# Patient Record
Sex: Female | Born: 1945 | Hispanic: No | Marital: Single | Smoking: Never smoker
Health system: Southern US, Community
[De-identification: ages and names within clinical notes are randomized; demographics above are authoritative.]

## PROBLEM LIST (undated history)

## (undated) DIAGNOSIS — D751 Secondary polycythemia: Secondary | ICD-10-CM

## (undated) DIAGNOSIS — I82409 Acute embolism and thrombosis of unspecified deep veins of unspecified lower extremity: Secondary | ICD-10-CM

## (undated) DIAGNOSIS — I10 Essential (primary) hypertension: Secondary | ICD-10-CM

---

## 2001-05-24 ENCOUNTER — Emergency Department (HOSPITAL_COMMUNITY): Admission: EM | Admit: 2001-05-24 | Discharge: 2001-05-25 | Payer: Self-pay | Admitting: Emergency Medicine

## 2001-05-24 ENCOUNTER — Encounter: Payer: Self-pay | Admitting: Emergency Medicine

## 2011-10-19 ENCOUNTER — Emergency Department (HOSPITAL_COMMUNITY): Payer: Self-pay

## 2011-10-19 ENCOUNTER — Encounter (HOSPITAL_COMMUNITY): Payer: Self-pay | Admitting: *Deleted

## 2011-10-19 ENCOUNTER — Emergency Department (HOSPITAL_COMMUNITY)
Admission: EM | Admit: 2011-10-19 | Discharge: 2011-10-19 | Disposition: A | Discharge status: Home / Self Care | Payer: Self-pay | Attending: Emergency Medicine | Admitting: Emergency Medicine

## 2011-10-19 DIAGNOSIS — M25519 Pain in unspecified shoulder: Secondary | ICD-10-CM | POA: Insufficient documentation

## 2011-10-19 DIAGNOSIS — I10 Essential (primary) hypertension: Secondary | ICD-10-CM | POA: Insufficient documentation

## 2011-10-19 DIAGNOSIS — R059 Cough, unspecified: Secondary | ICD-10-CM | POA: Insufficient documentation

## 2011-10-19 DIAGNOSIS — R05 Cough: Secondary | ICD-10-CM | POA: Insufficient documentation

## 2011-10-19 HISTORY — DX: Secondary polycythemia: D75.1

## 2011-10-19 HISTORY — DX: Essential (primary) hypertension: I10

## 2011-10-19 MED ORDER — OMEPRAZOLE 20 MG PO CPDR: 20.0000 mg | DELAYED_RELEASE_CAPSULE | Freq: Every day | ORAL | Status: DC

## 2011-10-19 MED ORDER — BENZONATATE 200 MG PO CAPS: 200.0000 mg | ORAL_CAPSULE | Freq: Three times a day (TID) | ORAL | Status: AC | PRN

## 2011-10-19 MED ORDER — OXYCODONE-ACETAMINOPHEN 10-325 MG PO TABS: 1.0000 | ORAL_TABLET | Freq: Every evening | ORAL | Status: AC | PRN

## 2011-10-19 NOTE — ED Notes (Signed)
Patient is alert and oriented x3.  She does not speak english and her son is translating for her. She is complaining of a non productive cough that started 2 weeks ago.  This morning she starts To here that she has a wheeze.  She denies pain, nausea, lightheadedness or dizziness

## 2011-10-19 NOTE — ED Provider Notes (Signed)
History     CSN: [id number]  Arrival date & time 10/19/11  1433   First MD Initiated Contact with Patient 10/19/11 1609      Chief Complaint  Patient presents with  . Cough    non productive    (Consider location/radiation/quality/duration/timing/severity/associated sxs/prior treatment) Patient is a 66 y.o. female presenting with cough and shoulder pain. The history is provided by the patient.  Cough This is a new problem. The current episode started more than 1 week ago. The problem has not changed since onset.The cough is non-productive. There has been no fever. Pertinent negatives include no chest pain, no chills, no sweats, no rhinorrhea, no sore throat, no myalgias and no shortness of breath. Associated symptoms comments: Cough for the past 2 weeks that is worse when she lies down at night. No chest pain, SOB, pleuritic pain, N, V, sinus symptoms, or sore throat. She reports a change in her blood pressure medications one month ago to include Diovan. . She is not a smoker.  Shoulder Pain This is a new problem. The current episode started 1 to 4 weeks ago. The problem occurs constantly. The problem has been unchanged. Associated symptoms include coughing. Pertinent negatives include no chest pain, chills, myalgias, nausea or sore throat. Associated symptoms comments: She fell one month ago and suffered a dislocation that was reduced by her regular physician. She continues to have periodic pain in the shoulder, especially when she sleeps on the left side. She was taking Percocet from a family member (she is here visiting) that relieved her pain and allowed her to sleep. No new injury. No numbness or weakness. .    Past Medical History  Diagnosis Date  . Hypertension   . Polycythemia     History reviewed. No pertinent past surgical history.  History reviewed. No pertinent family history.  History  Substance Use Topics  . Smoking status: Not on file  . Smokeless tobacco: Not on  file  . Alcohol Use:     OB History    Grav Para Term Preterm Abortions TAB SAB Ect Mult Living                  Review of Systems  Constitutional: Negative for chills.  HENT: Negative for sore throat and rhinorrhea.   Respiratory: Positive for cough. Negative for shortness of breath.   Cardiovascular: Negative for chest pain.  Gastrointestinal: Negative for nausea.  Musculoskeletal: Negative for myalgias.       See HPI.    Allergies  Review of patient's allergies indicates no known allergies.  Home Medications   Current Outpatient Rx  Name Route Sig Dispense Refill  . ATENOLOL-CHLORTHALIDONE 100-25 MG PO TABS Oral Take 1 tablet by mouth daily.    Marland Kitchen HYDROXYUREA 500 MG PO CAPS Oral Take 500 mg by mouth daily. May take with food to minimize GI side effects.    Marland Kitchen PROTECT CARDIO PO Oral Take 1 capsule by mouth daily.    Marland Kitchen VALSARTAN 320 MG PO TABS Oral Take 320 mg by mouth daily.      BP 132/91  Pulse 64  Temp 98.1 F (36.7 C) (Oral)  Resp 18  SpO2 99%  Physical Exam  Constitutional: She is oriented to person, place, and time. She appears well-developed and well-nourished. No distress.  Cardiovascular: Normal rate and regular rhythm.   No murmur heard. Pulmonary/Chest: Effort normal. She has no wheezes. She has no rales. She exhibits no tenderness.  Abdominal: Soft. Bowel sounds  are normal. There is no tenderness.  Musculoskeletal: She exhibits no edema and no tenderness.       Left shoulder appears unremarkable. FROM. Hand grip strength is 5/5 and equal bilaterally. Pulses 2+. Tender over humeral head/deltoid. No mass. Neck nontender.  Neurological: She is alert and oriented to person, place, and time.  Skin: Skin is warm.  Psychiatric: She has a normal mood and affect.    ED Course  Procedures (including critical care time)  Labs Reviewed - No data to display Dg Chest 2 View  10/19/2011  *RADIOLOGY REPORT*  Clinical Data: Cough.  Hypertension.  CHEST - 2 VIEW   Comparison: None.  Findings: Heart size is within normal limits.  Ectasia of the thoracic aorta is noted.  Both lungs are clear. Mild scarring noted in the central left upper lobe.  No evidence of pleural effusion. No mass or lymphadenopathy identified.  IMPRESSION: No active disease.  Original Report Authenticated By: Danae Orleans, M.D.     No diagnosis found. 1. Cough 2. Shoulder pain, old   MDM  CXR negative. VSS. No symptoms of SOB, pain or illness. Consider Diovan as source of cough, but less likely than ACE medications to cause cough (2% of patients). Consider reflux and will try patient on Prilosec.        Rodena Medin, PA-C 10/19/11 1741

## 2011-10-23 NOTE — ED Provider Notes (Signed)
Medical screening examination/treatment/procedure(s) were performed by non-physician practitioner and as supervising physician I was immediately available for consultation/collaboration.  Juliet Rude. Rubin Payor, MD 10/23/11 [phone]

## 2013-08-16 IMAGING — CR DG CHEST 2V
2 series · 2 of 2 positions shown · non-contrast
Comparison: None.

CLINICAL DATA: Cough.  Hypertension.

CHEST - 2 VIEW

[w chest pa]
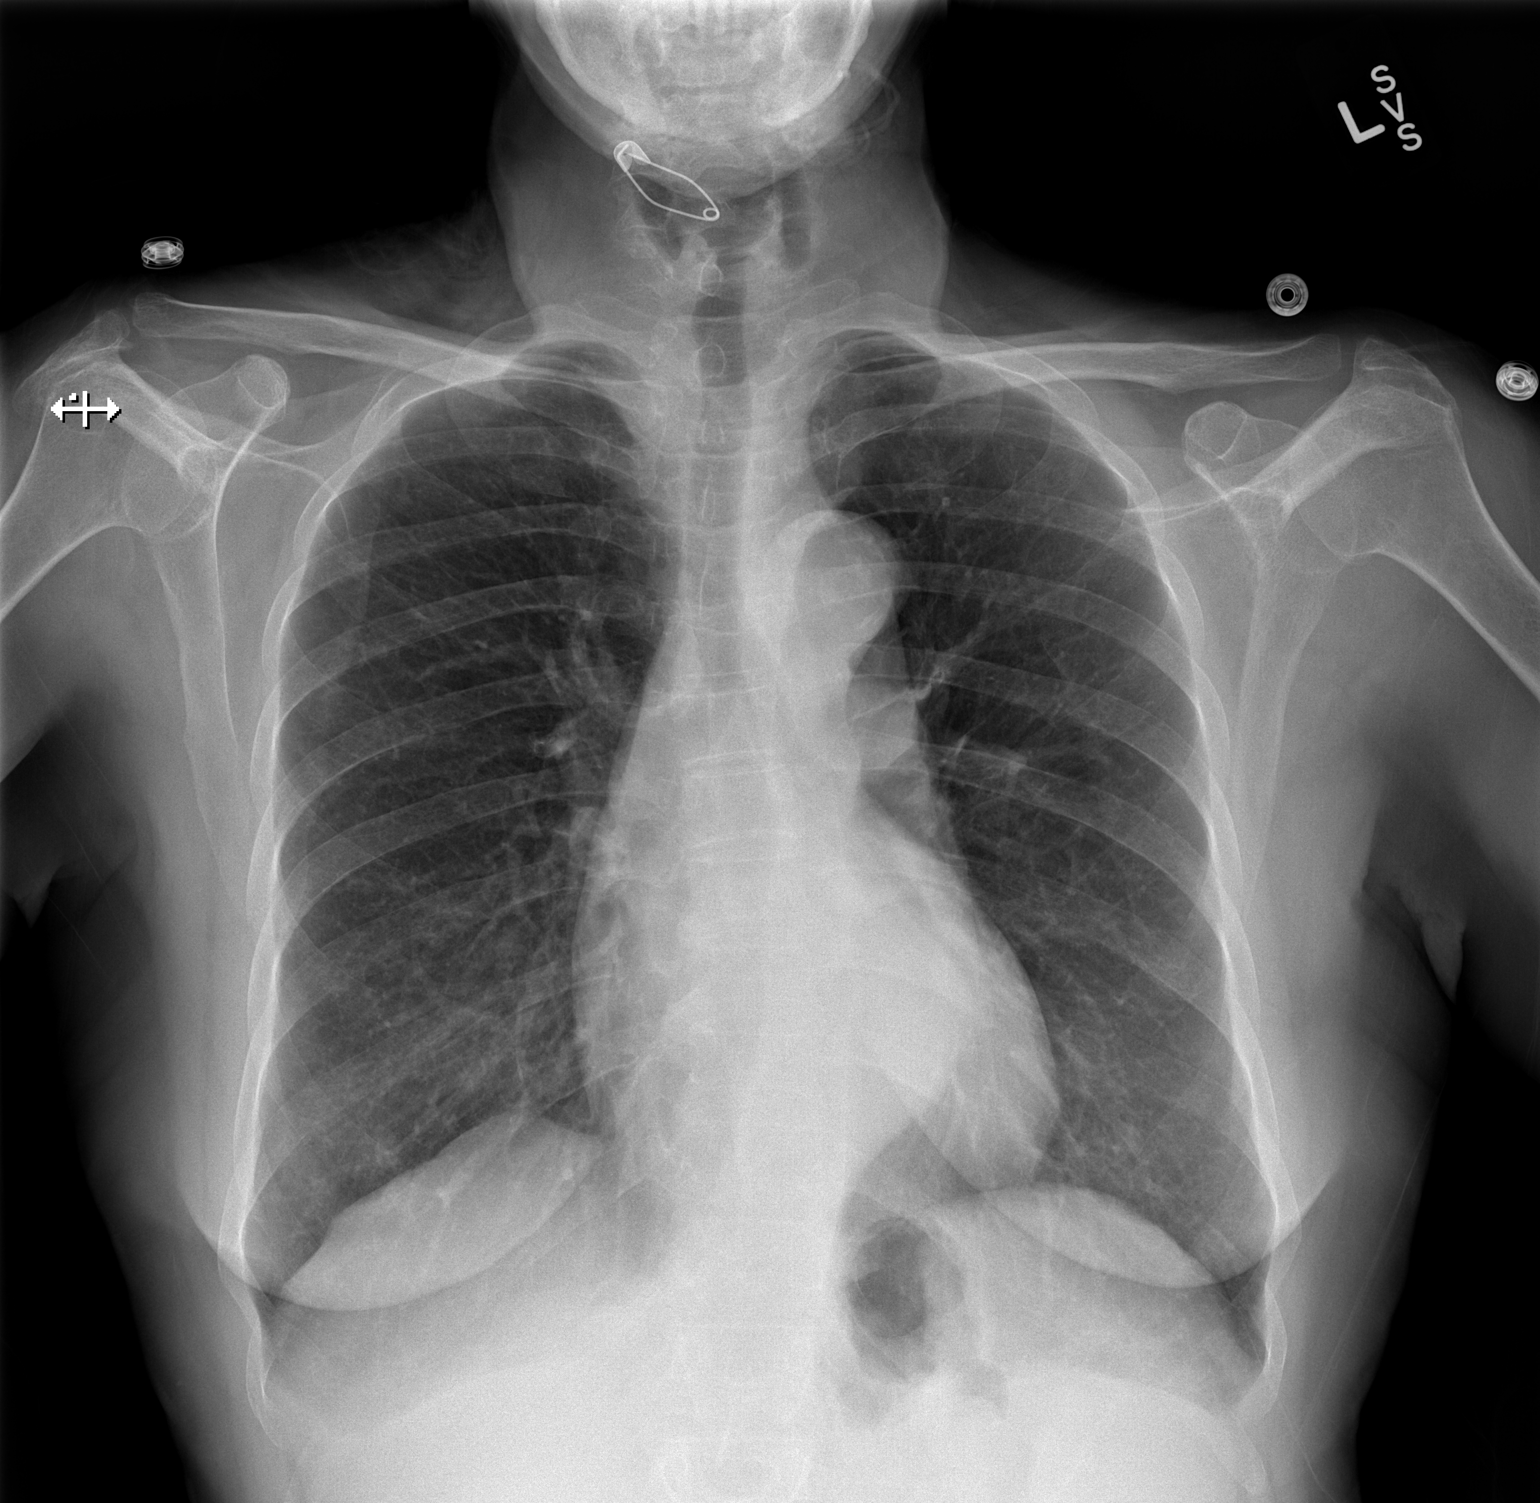

[w chest lat]
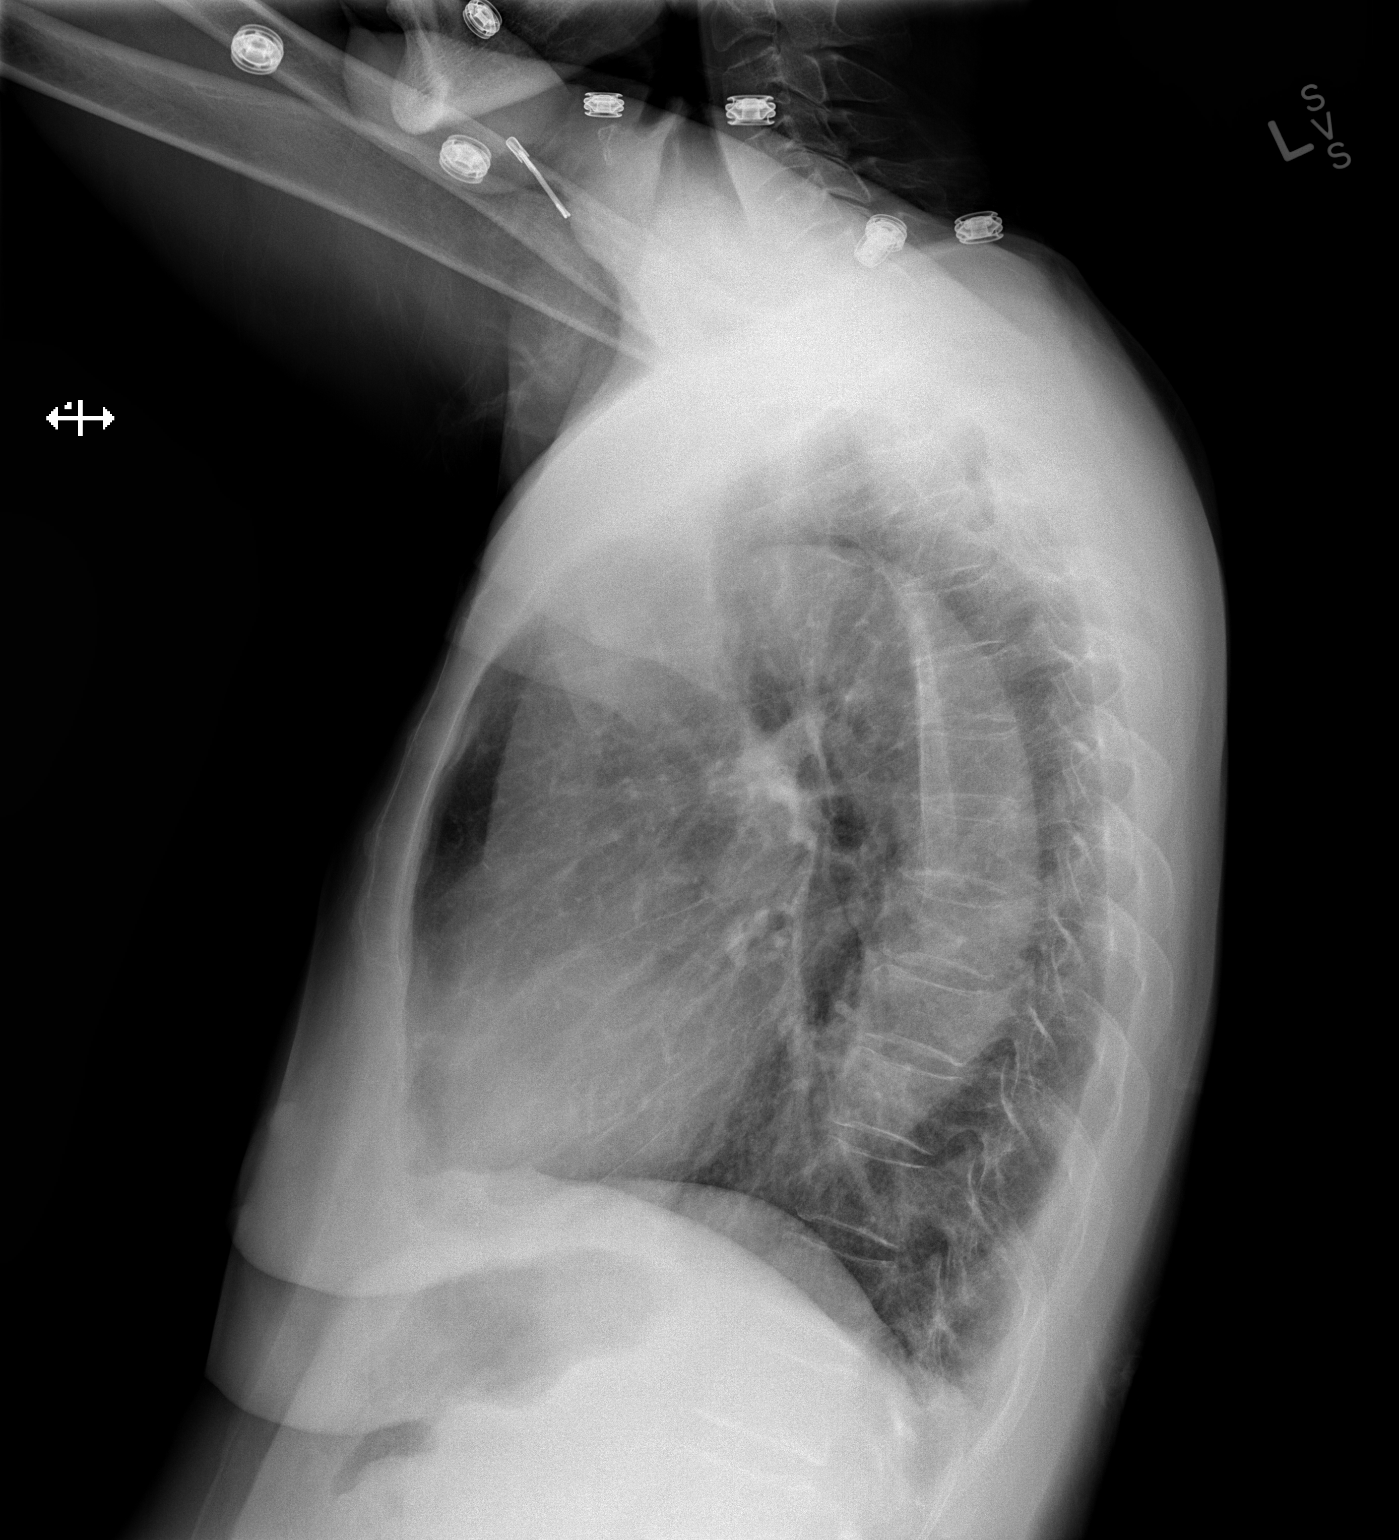

[2 of 2 positions shown; findings below may reference images not displayed]

FINDINGS: Heart size is within normal limits.  Ectasia of the
thoracic aorta is noted.  Both lungs are clear. Mild scarring noted
in the central left upper lobe.  No evidence of pleural effusion.
No mass or lymphadenopathy identified.
IMPRESSION: No active disease.

## 2017-07-05 ENCOUNTER — Encounter (HOSPITAL_COMMUNITY): Payer: Self-pay | Admitting: Emergency Medicine

## 2017-07-05 ENCOUNTER — Emergency Department (HOSPITAL_COMMUNITY)
Admission: EM | Admit: 2017-07-05 | Discharge: 2017-07-06 | Disposition: A | Discharge status: Home / Self Care | Payer: Self-pay | Attending: Emergency Medicine | Admitting: Emergency Medicine

## 2017-07-05 DIAGNOSIS — Z79899 Other long term (current) drug therapy: Secondary | ICD-10-CM | POA: Insufficient documentation

## 2017-07-05 DIAGNOSIS — I10 Essential (primary) hypertension: Secondary | ICD-10-CM | POA: Insufficient documentation

## 2017-07-05 DIAGNOSIS — M79604 Pain in right leg: Secondary | ICD-10-CM | POA: Insufficient documentation

## 2017-07-05 MED ORDER — TRAMADOL HCL 50 MG PO TABS
50.0000 mg | ORAL_TABLET | Freq: Once | ORAL | Status: AC
  Administered 2017-07-05: 50 mg via ORAL
  Filled 2017-07-05: qty 1

## 2017-07-05 NOTE — ED Triage Notes (Addendum)
Patient presents with son stating [place] and is having right behind knee/ calf pain onset of this afternoon. No swelling noted to leg. Son reports blood work done in Feb showed elevated platelets. Son states pt was diagnosed with thrombocytopenia.

## 2017-07-05 NOTE — ED Provider Notes (Signed)
[facility] DEPT Provider Note   CSN: [id number] Arrival date & time: 07/05/17  2105     History   Chief Complaint Chief Complaint  Patient presents with  . Leg Pain    HPI Misty Herrera is a 72 y.o. female.  The history is provided by the patient and a relative. A language interpreter was used (She speaks Arabic, prefers to use family as interpreter).  Leg Pain   This is a new problem. The current episode started more than 2 days ago. The problem occurs daily. The problem has been gradually worsening. The pain is present in the right lower leg. The quality of the pain is described as dull. The pain is moderate. Associated symptoms include limited range of motion. She has tried rest for the symptoms. The treatment provided no relief.   Patient with polycythemia, hypertension presents with right leg pain.  She is here visiting from [place].  She is been here for a week.  Since arriving to the [place], she been having pain in her right leg.  No fall or trauma.  She also has chronic wounds on her hands and feet, due to history of polycythemia.  She usually takes hydroxyurea and tramadol for her polycythemia.  She is scheduled to follow-up with a hematologist locally soon Past Medical History:  Diagnosis Date  . Hypertension   . Polycythemia     There are no active problems to display for this patient.   History reviewed. No pertinent surgical history.   OB History   None      Home Medications    Prior to Admission medications   Medication Sig Start Date End Date Taking? Authorizing Provider  amLODIPine-Valsartan-HCTZ (EXFORGE HCT) 10-160-12.5 MG TABS Take 1 tablet by mouth every evening.    Yes [provider]  hydroxyurea (HYDREA) 500 MG capsule Take 1,000 mg by mouth daily. May take with food to minimize GI side effects.    Yes [provider]  levothyroxine (SYNTHROID, LEVOTHROID) 50 MCG tablet Take 50 mcg by mouth daily before  breakfast.   Yes [provider]  valsartan-hydrochlorothiazide (DIOVAN-HCT) 160-12.5 MG tablet Take 1 tablet by mouth daily.   Yes [provider]    Family History No family history on file.  Social History Social History   Tobacco Use  . Smoking status: Never Smoker  . Smokeless tobacco: Never Used  Substance Use Topics  . Alcohol use: Not on file  . Drug use: Not on file     Allergies   Patient has no known allergies.   Review of Systems Review of Systems  Constitutional: Negative for fever.  Respiratory: Negative for shortness of breath.   Cardiovascular: Negative for chest pain.  Musculoskeletal: Positive for arthralgias.  Skin: Positive for wound.  All other systems reviewed and are negative.    Physical Exam Updated Vital Signs BP 98/73 (BP Location: Right Arm)   Pulse 64   Temp (!) 97.5 F (36.4 C) (Oral)   Resp 17   SpO2 98%   Physical Exam  CONSTITUTIONAL: Elderly and frail HEAD: Normocephalic/atraumatic EYES: EOMI ENMT: Mucous membranes moist NECK: supple no meningeal signs SPINE/BACK:entire spine nontender CV: S1/S2 noted, no murmurs/rubs/gallops noted LUNGS: Lungs are clear to auscultation bilaterally, no apparent distress ABDOMEN: soft, nontender GU:no cva tenderness NEURO: Pt is awake/alert/appropriate, moves all extremitiesx4.  No facial droop.   EXTREMITIES: full ROM, feet are warm to touch.  She has chronic wounds on hands and feet, that  are not secondarily infected.  Right lower extremity questionable edema when compared to left.  She has right calf tenderness.  She has tenderness to right popliteal fossa, no thrill.  Tenderness to posterior thigh.  There is no erythema or crepitus.  No visible trauma. SKIN: warm, color normal PSYCH: no abnormalities of mood noted, alert and oriented to situation  ED Treatments / Results  Labs (all labs ordered are listed, but only abnormal results are displayed) Labs Reviewed  CBC  WITH DIFFERENTIAL/PLATELET - Abnormal; Notable for the following components:      Result Value   RBC 3.67 (*)    MCV 107.4 (*)    RDW 22.5 (*)    Platelets 890 (*)    Basophils Absolute 0.2 (*)    All other components within normal limits  BASIC METABOLIC PANEL - Abnormal; Notable for the following components:   Glucose, Bld 124 (*)    BUN 38 (*)    Creatinine, Ser 1.37 (*)    Calcium 8.7 (*)    GFR calc non Af Amer 37 (*)    GFR calc Af Amer 43 (*)    All other components within normal limits  APTT - Abnormal; Notable for the following components:   aPTT 38 (*)    All other components within normal limits  PROTIME-INR    EKG None  Radiology No results found.  Procedures Procedures (including critical care time)  Medications Ordered in ED Medications  apixaban (ELIQUIS) tablet 5 mg (has no administration in time range)  traMADol (ULTRAM) tablet 50 mg (50 mg Oral Given 07/05/17 2338)     Initial Impression / Assessment and Plan / ED Course  I have reviewed the triage vital signs and the nursing notes.  Pertinent labs  results that were available during my care of the patient were reviewed by me and considered in my medical decision making (see chart for details).     12:00 AM  Pulses found in both feet with bedside Doppler.  No signs of cellulitis.  I am concerned for DVT. DVT ultrasound ordered for later today.  One-time dose of Eliquis ordered, will give 5 mg due to age, she is very small for her age, and also renal insufficiency. Short course of tramadol given, narcotic database reviewed and considered in decision making Son reports she is already scheduled to follow-up with a hematologist.  Thrombocytosis noted, but I feel she is safe for discharge Final Clinical Impressions(s) / ED Diagnoses   Final diagnoses:  Right leg pain    ED Discharge Orders        Ordered    LE VENOUS     07/06/17 0045    traMADol (ULTRAM) 50 MG tablet  Every 6 hours PRN      07/06/17 0109       Ripley Fraise, MD 07/06/17 0112

## 2017-07-06 ENCOUNTER — Ambulatory Visit (HOSPITAL_COMMUNITY)
Admission: RE | Admit: 2017-07-06 | Discharge: 2017-07-06 | Disposition: A | Discharge status: Home / Self Care | Payer: Self-pay | Source: Ambulatory Visit | Attending: Emergency Medicine | Admitting: Emergency Medicine

## 2017-07-06 ENCOUNTER — Encounter (HOSPITAL_COMMUNITY): Payer: Self-pay

## 2017-07-06 ENCOUNTER — Other Ambulatory Visit: Payer: Self-pay

## 2017-07-06 ENCOUNTER — Emergency Department (HOSPITAL_COMMUNITY)
Admission: EM | Admit: 2017-07-06 | Discharge: 2017-07-06 | Disposition: A | Discharge status: Home / Self Care | Payer: Self-pay | Attending: Emergency Medicine | Admitting: Emergency Medicine

## 2017-07-06 DIAGNOSIS — Z79899 Other long term (current) drug therapy: Secondary | ICD-10-CM | POA: Insufficient documentation

## 2017-07-06 DIAGNOSIS — M79604 Pain in right leg: Secondary | ICD-10-CM | POA: Insufficient documentation

## 2017-07-06 DIAGNOSIS — M7989 Other specified soft tissue disorders: Secondary | ICD-10-CM | POA: Insufficient documentation

## 2017-07-06 DIAGNOSIS — I824Z1 Acute embolism and thrombosis of unspecified deep veins of right distal lower extremity: Secondary | ICD-10-CM | POA: Insufficient documentation

## 2017-07-06 DIAGNOSIS — I82401 Acute embolism and thrombosis of unspecified deep veins of right lower extremity: Secondary | ICD-10-CM | POA: Insufficient documentation

## 2017-07-06 DIAGNOSIS — I10 Essential (primary) hypertension: Secondary | ICD-10-CM | POA: Insufficient documentation

## 2017-07-06 DIAGNOSIS — M79609 Pain in unspecified limb: Secondary | ICD-10-CM

## 2017-07-06 LAB — CBC WITH DIFFERENTIAL/PLATELET
BASOS ABS: 0.2 10*3/uL — AB (ref 0.0–0.1)
Basophils Relative: 4 %
EOS PCT: 4 %
Eosinophils Absolute: 0.2 10*3/uL (ref 0.0–0.7)
HEMATOCRIT: 39.4 % (ref 36.0–46.0)
HEMOGLOBIN: 12.3 g/dL (ref 12.0–15.0)
LYMPHS ABS: 0.9 10*3/uL (ref 0.7–4.0)
Lymphocytes Relative: 14 %
MCH: 33.5 pg (ref 26.0–34.0)
MCHC: 31.2 g/dL (ref 30.0–36.0)
MCV: 107.4 fL — ABNORMAL HIGH (ref 78.0–100.0)
MONOS PCT: 4 %
Monocytes Absolute: 0.2 10*3/uL (ref 0.1–1.0)
NEUTROS ABS: 4.7 10*3/uL (ref 1.7–7.7)
Neutrophils Relative %: 74 %
Platelets: 890 10*3/uL — ABNORMAL HIGH (ref 150–400)
RBC: 3.67 MIL/uL — AB (ref 3.87–5.11)
RDW: 22.5 % — AB (ref 11.5–15.5)
WBC: 6.2 10*3/uL (ref 4.0–10.5)

## 2017-07-06 LAB — BASIC METABOLIC PANEL
ANION GAP: 9 (ref 5–15)
BUN: 38 mg/dL — ABNORMAL HIGH (ref 6–20)
CHLORIDE: 104 mmol/L (ref 101–111)
CO2: 27 mmol/L (ref 22–32)
Calcium: 8.7 mg/dL — ABNORMAL LOW (ref 8.9–10.3)
Creatinine, Ser: 1.37 mg/dL — ABNORMAL HIGH (ref 0.44–1.00)
GFR calc non Af Amer: 37 mL/min — ABNORMAL LOW (ref >60)
GFR, EST AFRICAN AMERICAN: 43 mL/min — AB (ref >60)
GLUCOSE: 124 mg/dL — AB (ref 65–99)
Potassium: 4.2 mmol/L (ref 3.5–5.1)
Sodium: 140 mmol/L (ref 135–145)

## 2017-07-06 LAB — PROTIME-INR
INR: 1.2
Prothrombin Time: 15.1 seconds (ref 11.4–15.2)

## 2017-07-06 LAB — APTT: APTT: 38 s — AB (ref 24–36)

## 2017-07-06 MED ORDER — APIXABAN 5 MG PO TABS
5.0000 mg | ORAL_TABLET | Freq: Once | ORAL | Status: AC
  Administered 2017-07-06: 5 mg via ORAL
  Filled 2017-07-06: qty 1

## 2017-07-06 MED ORDER — RIVAROXABAN (XARELTO) EDUCATION KIT FOR DVT/PE PATIENTS
PACK | Freq: Once | Status: AC
Start: 2017-07-06 — End: 2017-07-06
  Administered 2017-07-06: 23:00:00
  Filled 2017-07-06: qty 1

## 2017-07-06 MED ORDER — TRAMADOL HCL 50 MG PO TABS: 50.0000 mg | ORAL_TABLET | Freq: Four times a day (QID) | ORAL | 0 refills | Status: DC | PRN

## 2017-07-06 MED ORDER — RIVAROXABAN (XARELTO) VTE STARTER PACK (15 & 20 MG): ORAL_TABLET | ORAL | 0 refills | Status: AC

## 2017-07-06 MED ORDER — RIVAROXABAN 15 MG PO TABS
15.0000 mg | ORAL_TABLET | Freq: Once | ORAL | Status: AC
  Administered 2017-07-06: 15 mg via ORAL
  Filled 2017-07-06 (×2): qty 1

## 2017-07-06 NOTE — ED Notes (Signed)
Bilateral pedal pulses heard per doppler

## 2017-07-06 NOTE — Progress Notes (Signed)
Right lower extremity venous duplex completed. Positive for an intramuscular DVT of the gastrocnemius vein in the calf. There is no evidence of a superficial thrombosis pr Baker's cyst. [place] Joshua Soulier,RVS 07/06/2017 5:32 PM

## 2017-07-06 NOTE — ED Triage Notes (Signed)
Pt was seen at [facility] last night for right knee/ calf pain and went for vascular study today that was + for DVT.

## 2017-07-06 NOTE — Discharge Instructions (Addendum)
Elevate the right leg above the heart is much as possible.  Also use heat on it 3 or 4 times a day.  Follow-up with your hematologist as planned.  They can assist you with further treatment for the DVT.  You will likely need to stay on Xarelto, 20 mg a day, for at least 6 months, following initial treatment that we have prescribed tonight.

## 2017-07-06 NOTE — ED Provider Notes (Signed)
[facility] EMERGENCY DEPARTMENT Provider Note   CSN: [id number] Arrival date & time: 07/06/17  1722     History   Chief Complaint No chief complaint on file.   HPI Misty Herrera is a 72 y.o. female.  She is here for evaluation of right lower leg pain and swelling, diagnosed today with DVT.  She was therefore sent here for further evaluation and treatment.  Patient recently traveled to the [place] from [place] by air.  For several days she has had pain and swelling in the right lower leg.  No ongoing shortness of breath, cough, weakness, dizziness, fever or chills.  She is being treated for chronic skin infections which are reportedly associated with polycythemia.  Her son has lab tests which indicate elevation of platelets but normal other cell lines.  She has an upcoming appointment with a hematologist to further evaluate this disorder.  HPI  Past Medical History:  Diagnosis Date  . Hypertension   . Polycythemia     There are no active problems to display for this patient.   History reviewed. No pertinent surgical history.   OB History   None      Home Medications    Prior to Admission medications   Medication Sig Start Date End Date Taking? Authorizing Provider  amLODIPine-Valsartan-HCTZ (EXFORGE HCT) 10-160-12.5 MG TABS Take 1 tablet by mouth every evening.    Yes [provider]  hydroxyurea (HYDREA) 500 MG capsule Take 1,000 mg by mouth daily. May take with food to minimize GI side effects.    Yes [provider]  levothyroxine (SYNTHROID, LEVOTHROID) 50 MCG tablet Take 50 mcg by mouth daily before breakfast.   Yes [provider]  Oxycodone HCl 10 MG TABS Take 10 mg by mouth as needed. 07/05/17  Yes [provider]  traMADol (ULTRAM) 50 MG tablet Take 1 tablet (50 mg total) by mouth every 6 (six) hours as needed for severe pain. 07/06/17  Yes Ripley Fraise, MD  valsartan-hydrochlorothiazide (DIOVAN-HCT)  160-12.5 MG tablet Take 1 tablet by mouth daily.   Yes [provider]    Family History No family history on file.  Social History Social History   Tobacco Use  . Smoking status: Never Smoker  . Smokeless tobacco: Never Used  Substance Use Topics  . Alcohol use: Not on file  . Drug use: Not on file     Allergies   Patient has no known allergies.   Review of Systems Review of Systems  All other systems reviewed and are negative.    Physical Exam Updated Vital Signs BP (!) 97/59   Pulse 63   Temp 98 F (36.7 C) (Oral)   Resp 16   SpO2 95%   Physical Exam  Constitutional: She is oriented to person, place, and time. She appears well-developed. No distress.  Elderly, frail  HENT:  Head: Normocephalic and atraumatic.  Eyes: Pupils are equal, round, and reactive to light. Conjunctivae and EOM are normal.  Neck: Normal range of motion and phonation normal. Neck supple.  Cardiovascular: Normal rate.  Pulmonary/Chest: Effort normal.  Musculoskeletal: Normal range of motion.  Right lower leg, mildly swollen and tender calf.  No palpable popliteal cord in the right.  Neurovascular intact distally in the toes of the right foot.  Neurological: She is alert and oriented to person, place, and time. She exhibits normal muscle tone.  Skin: Skin is warm and dry.  Scattered wounds, left foot, and fingers of right  hand, which are characterized by chronic appearance with excoriations, drainage and dried exudate.  No associated bleeding or fluctuance with these lesions.  Psychiatric: She has a normal mood and affect. Her behavior is normal. Judgment and thought content normal.  Nursing note and vitals reviewed.    ED Treatments / Results  Labs (all labs ordered are listed, but only abnormal results are displayed) Labs Reviewed - No data to display  EKG None  Radiology No results found.  Procedures Procedures (including critical care time)  Medications Ordered  in ED Medications  rivaroxaban (XARELTO) Education Kit for DVT/PE patients (has no administration in time range)  Rivaroxaban (XARELTO) tablet 15 mg (has no administration in time range)     Initial Impression / Assessment and Plan / ED Course  I have reviewed the triage vital signs and the nursing notes.  Pertinent labs & imaging results that were available during my care of the patient were reviewed by me and considered in my medical decision making (see chart for details).      Patient Vitals for the past 24 hrs:  BP Temp Temp src Pulse Resp SpO2  07/06/17 2009 (!) 97/59 98 F (36.7 C) Oral 63 16 95 %  07/06/17 1757 105/79 98.1 F (36.7 C) Oral 76 18 98 %    At D/C- Reevaluation with update and discussion. After initial assessment and treatment, an updated evaluation reveals no further c/o, findings discussed and questions answered. [place] decision making-uncomplicated right lower leg DVT, without evidence for pulmonary embolus.  Doubt serious bacterial infection, metabolic instability or impending vascular collapse.  Treatment for DVT begun in the emergency department.   Nursing Notes Reviewed/ Care Coordinated Applicable Imaging Reviewed Interpretation of Laboratory Data incorporated into ED treatment  The patient appears reasonably screened and/or stabilized for discharge and I doubt any other medical condition or other [facility] requiring further screening, evaluation, or treatment in the ED at this time prior to discharge.  Plan: Home Medications- usual; Home Treatments- rest, leg elevation and heat; return here if the recommended treatment, does not improve the symptoms; Recommended follow up- PCP 1-2 weeks and prn    Final Clinical Impressions(s) / ED Diagnoses   Final diagnoses:  Acute deep vein thrombosis (DVT) of right lower extremity, unspecified vein [facility])    ED Discharge Orders    None       Daleen Bo, MD 07/08/17 2050

## 2017-07-07 ENCOUNTER — Telehealth: Payer: Self-pay | Admitting: Internal Medicine

## 2017-07-07 NOTE — Telephone Encounter (Signed)
An urgent appt has been scheduled with the pt's son, Wille Glaser, for the pt to see Dr. Julien Nordmann on 4/4 at 1130am. Aware his mother should arrive 30 minutes early. Voiced understanding.

## 2017-07-09 ENCOUNTER — Inpatient Hospital Stay: Payer: Self-pay

## 2017-07-09 ENCOUNTER — Inpatient Hospital Stay: Payer: Self-pay | Attending: Internal Medicine | Admitting: Internal Medicine

## 2017-07-09 ENCOUNTER — Encounter: Payer: Self-pay | Admitting: Internal Medicine

## 2017-07-09 ENCOUNTER — Telehealth: Payer: Self-pay | Admitting: Internal Medicine

## 2017-07-09 VITALS — BP 99/60 | HR 60 | Temp 97.9°F | Resp 18 | Wt 115.4 lb

## 2017-07-09 DIAGNOSIS — M79609 Pain in unspecified limb: Secondary | ICD-10-CM | POA: Insufficient documentation

## 2017-07-09 DIAGNOSIS — L97909 Non-pressure chronic ulcer of unspecified part of unspecified lower leg with unspecified severity: Secondary | ICD-10-CM | POA: Insufficient documentation

## 2017-07-09 DIAGNOSIS — L97921 Non-pressure chronic ulcer of unspecified part of left lower leg limited to breakdown of skin: Secondary | ICD-10-CM

## 2017-07-09 DIAGNOSIS — L98499 Non-pressure chronic ulcer of skin of other sites with unspecified severity: Secondary | ICD-10-CM | POA: Insufficient documentation

## 2017-07-09 DIAGNOSIS — R239 Unspecified skin changes: Secondary | ICD-10-CM

## 2017-07-09 DIAGNOSIS — I82491 Acute embolism and thrombosis of other specified deep vein of right lower extremity: Secondary | ICD-10-CM

## 2017-07-09 DIAGNOSIS — D45 Polycythemia vera: Secondary | ICD-10-CM | POA: Insufficient documentation

## 2017-07-09 DIAGNOSIS — L97911 Non-pressure chronic ulcer of unspecified part of right lower leg limited to breakdown of skin: Secondary | ICD-10-CM

## 2017-07-09 DIAGNOSIS — I10 Essential (primary) hypertension: Secondary | ICD-10-CM

## 2017-07-09 DIAGNOSIS — D473 Essential (hemorrhagic) thrombocythemia: Secondary | ICD-10-CM

## 2017-07-09 DIAGNOSIS — E039 Hypothyroidism, unspecified: Secondary | ICD-10-CM

## 2017-07-09 DIAGNOSIS — R52 Pain, unspecified: Secondary | ICD-10-CM | POA: Insufficient documentation

## 2017-07-09 LAB — CMP (CANCER CENTER ONLY)
ALBUMIN: 3.4 g/dL — AB (ref 3.5–5.0)
ALT: 8 U/L (ref 0–55)
ANION GAP: 6 (ref 3–11)
AST: 15 U/L (ref 5–34)
Alkaline Phosphatase: 68 U/L (ref 40–150)
BUN: 41 mg/dL — ABNORMAL HIGH (ref 7–26)
CHLORIDE: 104 mmol/L (ref 98–109)
CO2: 27 mmol/L (ref 22–29)
Calcium: 9 mg/dL (ref 8.4–10.4)
Creatinine: 1.24 mg/dL — ABNORMAL HIGH (ref 0.60–1.10)
GFR, EST AFRICAN AMERICAN: 48 mL/min — AB (ref >60)
GFR, Estimated: 42 mL/min — ABNORMAL LOW (ref >60)
Glucose, Bld: 111 mg/dL (ref 70–140)
POTASSIUM: 4.8 mmol/L (ref 3.5–5.1)
Sodium: 137 mmol/L (ref 136–145)
Total Bilirubin: 0.6 mg/dL (ref 0.2–1.2)
Total Protein: 7 g/dL (ref 6.4–8.3)

## 2017-07-09 LAB — CBC WITH DIFFERENTIAL (CANCER CENTER ONLY)
BASOS ABS: 0.2 10*3/uL — AB (ref 0.0–0.1)
Basophils Relative: 3 %
EOS ABS: 0.1 10*3/uL (ref 0.0–0.5)
Eosinophils Relative: 1 %
HEMATOCRIT: 38.3 % (ref 34.8–46.6)
HEMOGLOBIN: 12.3 g/dL (ref 11.6–15.9)
LYMPHS PCT: 11 %
Lymphs Abs: 0.7 10*3/uL — ABNORMAL LOW (ref 0.9–3.3)
MCH: 34.5 pg — AB (ref 25.1–34.0)
MCHC: 32 g/dL (ref 31.5–36.0)
MCV: 107.9 fL — AB (ref 79.5–101.0)
Monocytes Absolute: 0.4 10*3/uL (ref 0.1–0.9)
Monocytes Relative: 6 %
NEUTROS ABS: 5.3 10*3/uL (ref 1.5–6.5)
NEUTROS PCT: 79 %
Platelet Count: 956 10*3/uL (ref 145–400)
RBC: 3.55 MIL/uL — ABNORMAL LOW (ref 3.70–5.45)
RDW: 22.6 % — ABNORMAL HIGH (ref 11.2–14.5)
WBC Count: 6.7 10*3/uL (ref 3.9–10.3)

## 2017-07-09 LAB — LACTATE DEHYDROGENASE: LDH: 274 U/L — AB (ref 125–245)

## 2017-07-09 MED ORDER — ANAGRELIDE HCL 1 MG PO CAPS: 1.0000 mg | ORAL_CAPSULE | Freq: Two times a day (BID) | ORAL | 2 refills | Status: DC

## 2017-07-09 MED ORDER — OXYCODONE-ACETAMINOPHEN 10-325 MG PO TABS: 1.0000 | ORAL_TABLET | Freq: Four times a day (QID) | ORAL | 0 refills | Status: DC | PRN

## 2017-07-09 MED FILL — ANAGRELIDE HCL 1 MG CAPSULE: 1 | 30 days supply | Qty: 60 | Fill #0

## 2017-07-09 MED FILL — OXYCODONE-APAP 10-325: 10-325 | 30 days supply | Qty: 120 | Fill #0

## 2017-07-09 NOTE — Telephone Encounter (Signed)
Scheduled appt per 4/4 los -Gave patient aVS and calender per los.

## 2017-07-09 NOTE — Progress Notes (Signed)
[facility] [phone]   [phone]  CONSULT NOTE  REFERRING PHYSICIAN: Everardo Beals, FNP  REASON FOR CONSULTATION:  72 years old white female with history of polycythemia vera and elevated platelets count.  HPI Misty Herrera is a 72 y.o. female with past medical history significant for polycythemia vera diagnosed in 2001 and has been on treatment with hydroxyurea since that time.  The patient also has a history of hypothyroidism, gout, hypertension as well as recently diagnosed deep venous thrombosis of the right lower extremity.  She was a started recently on treatment with Xarelto.  The patient mentions that she had a lot of complication from her treatment with hydroxyurea.  She was initially on 1000 mg twice daily and this was reduced to 1000 mg p.o. daily.  It was prescribed by her hematologist in [place].  She is currently visiting her son in [place].  Her treatment with hydroxyurea is complicated with significant skin rash as well as skin ulcers and several areas of the body including her fingers as well as leg ulcers.  She also has dry skin.  Her skin breaks easily and bleeds a lot.  She was noted on recent blood work to have significant elevation of her platelets count over 1 million.  She had severe pain from the skin ulcers and she has been tried on several pain medication including tramadol with no improvement.  She started taking some oxycodone from her son and it did help her a lot.  She is requesting refill of this medication.  She has no other complaints.  She denied having any nausea, vomiting, diarrhea or constipation.  She has no chest pain but has shortness breath with exertion and no significant cough or hemoptysis.  She has no fever or chills.  She itches a lot. The patient was referred to me today for evaluation and recommendation regarding her condition. Family history significant for mother with liver cirrhosis,  father had diabetes, son had colon cancer and currently under my care another son with polycythemia. The patient is a widow and has 9 children.  She was accompanied today by her son Misty Herrera.  She has no history for smoking, alcohol or drug abuse.   HPI  Past Medical History:  Diagnosis Date  . Hypertension   . Polycythemia     No past surgical history on file.  No family history on file.  Social History Social History   Tobacco Use  . Smoking status: Never Smoker  . Smokeless tobacco: Never Used  Substance Use Topics  . Alcohol use: Not on file  . Drug use: Not on file    No Known Allergies  Current Outpatient Medications  Medication Sig Dispense Refill  . amLODIPine-Valsartan-HCTZ (EXFORGE HCT) 10-160-12.5 MG TABS Take 1 tablet by mouth every evening.     . hydroxyurea (HYDREA) 500 MG capsule Take 1,000 mg by mouth daily. May take with food to minimize GI side effects.     Marland Kitchen levothyroxine (SYNTHROID, LEVOTHROID) 50 MCG tablet Take 50 mcg by mouth daily before breakfast.    . Oxycodone HCl 10 MG TABS Take 10 mg by mouth as needed.  0  . Rivaroxaban 15 & 20 MG TBPK Take as directed on package: Start with one 15mg  tablet by mouth twice a day with food. On Day 22, switch to one 20mg  tablet once a day with food. 51 each 0  . traMADol (ULTRAM) 50 MG tablet Take 1 tablet (50 mg  total) by mouth every 6 (six) hours as needed. 20 tablet 0  . valsartan-hydrochlorothiazide (DIOVAN-HCT) 160-12.5 MG tablet Take 1 tablet by mouth daily.     No current facility-administered medications for this visit.     Review of Systems  Constitutional: positive for anorexia, fatigue and weight loss Eyes: negative Ears, nose, mouth, throat, and face: negative Respiratory: positive for dyspnea on exertion Cardiovascular: negative Gastrointestinal: negative Genitourinary:negative Integument/breast: positive for dryness, pruritus, rash and skin lesion(s) Hematologic/lymphatic: positive for bleeding  and easy bruising Musculoskeletal:negative Neurological: negative Behavioral/Psych: negative Endocrine: negative Allergic/Immunologic: negative  Physical Exam  JJK:KXFGH, healthy, no distress, well nourished and well developed SKIN: skin color, texture, turgor are normal, no rashes or significant lesions HEAD: Normocephalic, No masses, lesions, tenderness or abnormalities EYES: normal, PERRLA, Conjunctiva are pink and non-injected EARS: External ears normal, Canals clear OROPHARYNX:no exudate, no erythema and lips, buccal mucosa, and tongue normal  NECK: supple, no adenopathy, no JVD LYMPH:  no palpable lymphadenopathy, no hepatosplenomegaly BREAST:not examined LUNGS: clear to auscultation , and palpation HEART: regular rate & rhythm, no murmurs and no gallops ABDOMEN:abdomen soft, non-tender, normal bowel sounds and no masses or organomegaly BACK: No CVA tenderness, Range of motion is normal EXTREMITIES:no joint deformities, effusion, or inflammation, no edema  NEURO: alert & oriented x 3 with fluent speech, no focal motor/sensory deficits        PERFORMANCE STATUS: ECOG 1  LABORATORY DATA: Lab Results  Component Value Date   WBC 6.2 07/05/2017   HGB 12.3 07/05/2017   HCT 39.4 07/05/2017   MCV 107.4 (H) 07/05/2017   PLT 890 (H) 07/05/2017      Chemistry      Component Value Date/Time   NA 140 07/05/2017 2349   K 4.2 07/05/2017 2349   CL 104 07/05/2017 2349   CO2 27 07/05/2017 2349   BUN 38 (H) 07/05/2017 2349   CREATININE 1.37 (H) 07/05/2017 2349      Component Value Date/Time   CALCIUM 8.7 (L) 07/05/2017 2349       RADIOGRAPHIC STUDIES: No results found.  ASSESSMENT: This is a very pleasant 72 years old white female with history of polycythemia vera diagnosed in 2001 status post phlebotomy at regular basis in addition to treatment with hydroxyurea that was complicated with significant skin lesions as well as ulcers and severe pain.  The patient also  presented today with significant elevation of her platelets count consistent with essential thrombocythemia.   PLAN: I had a lengthy discussion with the patient and her son today about her current condition and treatment options. I recommended for the patient to repeat CBC, comprehensive metabolic panel, LDH as well as Jak 2 mutations panel.. The patient has a lot of complication from treatment with hydroxyurea including dry skin, skin rash as well as a skin lesion and ulcers.  I recommended for her to hold this treatment for now.  I would consider her for repeat phlebotomy if she had significant elevation of her hemoglobin and hematocrit. For the essential thrombocythemia, I will start the patient on an anagrelide 1 mg p.o. twice daily.  I would repeat her CBC in 2 weeks for reevaluation.  I also ordered ultrasound of the abdomen to rule out splenomegaly. For the skin ulcers secondary to treatment with hydroxyurea, I would refer the patient to the wound clinic for evaluation and management of her condition. For pain management, I started the patient on Percocet 10/325 mg p.o. every 6 hours as needed for pain. For the  recently diagnosed right lower extremity deep venous thrombosis, the patient will continue her treatment with Xarelto. The patient will come back for follow-up visit in 2 weeks for evaluation and repeat blood work. She was advised to call immediately if she has any concerning symptoms in the interval. The patient voices understanding of current disease status and treatment options and is in agreement with the current care plan.  All questions were answered. The patient knows to call the clinic with any problems, questions or concerns. We can certainly see the patient much sooner if necessary.  Thank you so much for allowing me to participate in the care of Misty Herrera. I will continue to follow up the patient with you and assist in her care.  I spent 40 minutes counseling the  patient face to face. The total time spent in the appointment was 60 minutes.  Disclaimer: This note was dictated with voice recognition software. Similar sounding words can inadvertently be transcribed and may not be corrected upon review.   Eilleen Kempf July 09, 2017, 11:46 AM

## 2017-07-10 ENCOUNTER — Telehealth: Payer: Self-pay | Admitting: Medical Oncology

## 2017-07-10 DIAGNOSIS — D45 Polycythemia vera: Secondary | ICD-10-CM | POA: Insufficient documentation

## 2017-07-10 DIAGNOSIS — L97921 Non-pressure chronic ulcer of unspecified part of left lower leg limited to breakdown of skin: Secondary | ICD-10-CM

## 2017-07-10 DIAGNOSIS — D473 Essential (hemorrhagic) thrombocythemia: Secondary | ICD-10-CM | POA: Insufficient documentation

## 2017-07-10 DIAGNOSIS — E039 Hypothyroidism, unspecified: Secondary | ICD-10-CM | POA: Insufficient documentation

## 2017-07-10 DIAGNOSIS — R52 Pain, unspecified: Secondary | ICD-10-CM | POA: Insufficient documentation

## 2017-07-10 DIAGNOSIS — L97911 Non-pressure chronic ulcer of unspecified part of right lower leg limited to breakdown of skin: Secondary | ICD-10-CM | POA: Insufficient documentation

## 2017-07-10 NOTE — Telephone Encounter (Signed)
Requests test orders. Given to lab.

## 2017-07-13 ENCOUNTER — Telehealth: Payer: Self-pay | Admitting: Medical Oncology

## 2017-07-13 NOTE — Telephone Encounter (Signed)
Asking for wound care appt. -given . I called wound care clinic to request that they move up appt if able.

## 2017-07-14 ENCOUNTER — Telehealth: Payer: Self-pay

## 2017-07-14 ENCOUNTER — Other Ambulatory Visit: Payer: Self-pay | Admitting: Internal Medicine

## 2017-07-14 MED ORDER — PROCHLORPERAZINE MALEATE 10 MG PO TABS: 10.0000 mg | ORAL_TABLET | Freq: Four times a day (QID) | ORAL | 0 refills | Status: AC | PRN

## 2017-07-14 NOTE — Telephone Encounter (Signed)
Returned call to patients son. He is concerned with her pain medicine causing her to vomit, he is asking for different pain medication. He stated that her medication for her blood disorder isn't working and I explained to him it takes time for a medication to get into someone's system and begin to work. He also said her wounds are getting worse. Informed him that Diane called yesterday to try to move up her appointment for the wound clinic, we have not heard back as of yet. Informed him I would speak to Dr. Julien Nordmann regarding the pain medication and get back to him.   Dr. Julien Nordmann stated we cannot order another pain medication unless they bring in the previous ordered pain medication for [place] to discard. He is ordering her prochlorperazine for nausea and vomiting. He is sending it to [facility] listed in patients chart.  Cyndia Bent RN

## 2017-07-15 ENCOUNTER — Encounter (HOSPITAL_COMMUNITY): Payer: Self-pay | Admitting: Emergency Medicine

## 2017-07-15 ENCOUNTER — Ambulatory Visit (HOSPITAL_COMMUNITY)
Admission: EM | Admit: 2017-07-15 | Discharge: 2017-07-15 | Disposition: A | Discharge status: Home / Self Care | Payer: Self-pay | Attending: Family Medicine | Admitting: Family Medicine

## 2017-07-15 DIAGNOSIS — T402X5A Adverse effect of other opioids, initial encounter: Secondary | ICD-10-CM

## 2017-07-15 DIAGNOSIS — L989 Disorder of the skin and subcutaneous tissue, unspecified: Secondary | ICD-10-CM

## 2017-07-15 DIAGNOSIS — Z5189 Encounter for other specified aftercare: Secondary | ICD-10-CM

## 2017-07-15 DIAGNOSIS — D45 Polycythemia vera: Secondary | ICD-10-CM

## 2017-07-15 HISTORY — DX: Acute embolism and thrombosis of unspecified deep veins of unspecified lower extremity: I82.409

## 2017-07-15 MED ORDER — BACITRACIN ZINC 500 UNIT/GM EX OINT
TOPICAL_OINTMENT | CUTANEOUS | Status: AC
  Filled 2017-07-15: qty 0.9

## 2017-07-15 MED ORDER — TRAMADOL HCL 50 MG PO TABS: 100.0000 mg | ORAL_TABLET | Freq: Four times a day (QID) | ORAL | 0 refills | Status: AC | PRN

## 2017-07-15 MED ORDER — MUPIROCIN 2 % EX OINT: 1.0000 "application " | TOPICAL_OINTMENT | Freq: Two times a day (BID) | CUTANEOUS | 0 refills | Status: AC

## 2017-07-15 NOTE — Discharge Instructions (Signed)
I have switched her pain medicine to tramadol.  Please follow-up with wound care as well as Dr. Earlie Server for further treatment and evaluation.  Please change her dressings at least every other day to monitor for worsening sores.  Please return if she has worsening swelling, redness, pain or sores.

## 2017-07-15 NOTE — ED Provider Notes (Signed)
[facility]    CSN: [id number] Arrival date & time: 07/15/17  1721     History   Chief Complaint Chief Complaint  Patient presents with  . Wound Check    HPI Misty Herrera is a 72 y.o. female history of polycythemia vera, hypertension, DVT presenting today for follow-up on pain and hand and foot sores.  Notes that she has been on hydroxyurea for a long time due to her polycythemia.  Dr. Earlie Server recently switched her to a different medicine last week for this as it was causing significant sores on her hands and feet.  They also note that her doctor gave her a prescription for oxycodone with acetaminophen for the pain caused by the sores, but this is caused her constipation and dizziness.  She states that in the past that she has done well with tramadol.  They are requesting switching to the tramadol.  Of note she was recently diagnosed with a DVT, and started on Xarelto last week.  This is also contributed to her sores and making them bleed easier and drained more.   HPI  Past Medical History:  Diagnosis Date  . DVT (deep venous thrombosis) ([place])   . Hypertension   . Polycythemia     Patient Active Problem List   Diagnosis Date Noted  . Essential thrombocythemia ([place]) 07/10/2017  . Polycythemia vera ([place]) 07/10/2017  . Hypothyroidism (acquired) 07/10/2017  . Pain management 07/10/2017  . Ulcers of both lower extremities, limited to breakdown of skin ([place]) 07/10/2017    History reviewed. No pertinent surgical history.  OB History   None      Home Medications    Prior to Admission medications   Medication Sig Start Date End Date Taking? Authorizing Provider  amLODipine-valsartan (EXFORGE) 10-160 MG tablet Take 1 tablet by mouth daily.    [provider]  amLODIPine-Valsartan-HCTZ (EXFORGE HCT) 10-160-12.5 MG TABS Take 1 tablet by mouth every evening.     [provider]  anagrelide (AGRYLIN) 1 MG capsule Take 1 capsule (1 mg total) by  mouth 2 (two) times daily. 07/09/17   Curt Bears, MD  aspirin 81 MG chewable tablet Chew 81 mg by mouth daily.    [provider]  folic acid (FOLVITE) 809 MCG tablet Take 400 mcg by mouth daily.    [provider]  hydroxyurea (HYDREA) 500 MG capsule Take 1,000 mg by mouth daily. May take with food to minimize GI side effects.     [provider]  levothyroxine (SYNTHROID, LEVOTHROID) 50 MCG tablet Take 50 mcg by mouth daily before breakfast.    [provider]  mupirocin ointment (BACTROBAN) 2 % Apply 1 application topically 2 (two) times daily. 07/15/17   Aryonna Gunnerson C, PA-C  Oxycodone HCl 10 MG TABS Take 10 mg by mouth as needed. 07/05/17   [provider]  oxyCODONE-acetaminophen (PERCOCET) 10-325 MG tablet Take 1 tablet by mouth every 6 (six) hours as needed for pain. 07/09/17   Curt Bears, MD  prochlorperazine (COMPAZINE) 10 MG tablet Take 1 tablet (10 mg total) by mouth every 6 (six) hours as needed for nausea or vomiting. 07/14/17   Curt Bears, MD  Rivaroxaban 15 & 20 MG TBPK Take as directed on package: Start with one 15mg  tablet by mouth twice a day with food. On Day 22, switch to one 20mg  tablet once a day with food. 07/06/17   Daleen Bo, MD  traMADol (ULTRAM) 50 MG tablet Take 2 tablets (100 mg  total) by mouth every 6 (six) hours as needed for up to 14 days. 07/15/17 07/29/17  Lang Zingg C, PA-C  UNABLE TO FIND Take 5 mg by mouth daily. Bisoprolol Hemifumarate-beta blocker    [provider]  valsartan-hydrochlorothiazide (DIOVAN-HCT) 160-12.5 MG tablet Take 1 tablet by mouth daily.    [provider]    Family History No family history on file.  Social History Social History   Tobacco Use  . Smoking status: Never Smoker  . Smokeless tobacco: Never Used  Substance Use Topics  . Alcohol use: Not on file  . Drug use: Not on file     Allergies   Patient has no known allergies.   Review of  Systems Review of Systems  Constitutional: Negative for chills and fever.  HENT: Negative for ear pain and sore throat.   Eyes: Negative for pain and visual disturbance.  Respiratory: Negative for cough and shortness of breath.   Cardiovascular: Negative for chest pain and palpitations.  Gastrointestinal: Negative for abdominal pain and vomiting.  Genitourinary: Negative for dysuria and hematuria.  Musculoskeletal: Positive for arthralgias and myalgias. Negative for back pain.  Skin: Positive for color change and wound. Negative for rash.  Neurological: Negative for seizures and syncope.  All other systems reviewed and are negative.    Physical Exam Triage Vital Signs ED Triage Vitals [07/15/17 1731]  Enc Vitals Group     BP 113/66     Pulse Rate 65     Resp 16     Temp 98.1 F (36.7 C)     Temp src      SpO2 98 %     Weight      Height      Head Circumference      Peak Flow      Pain Score      Pain Loc      Pain Edu?      Excl. in [place]?    No data found.  Updated Vital Signs BP 113/66   Pulse 65   Temp 98.1 F (36.7 C)   Resp 16   SpO2 98%   Visual Acuity Right Eye Distance:   Left Eye Distance:   Bilateral Distance:    Right Eye Near:   Left Eye Near:    Bilateral Near:     Physical Exam  Constitutional: She appears well-developed and well-nourished. No distress.  HENT:  Head: Normocephalic and atraumatic.  Eyes: Conjunctivae are normal.  Neck: Neck supple.  Cardiovascular: Normal rate and regular rhythm.  No murmur heard. Pulmonary/Chest: Effort normal and breath sounds normal. No respiratory distress.  Breathing comfortably at rest, CTA BL  Abdominal: Soft. There is no tenderness.  Musculoskeletal: She exhibits no edema.  Mild swelling to dorsum of left foot  Neurological: She is alert.  Skin: Skin is warm and dry.  See pictures below, patient with chronic skin changes as well as open sores, erythematous open draining sore left middle finger,  larger open sore right medial malleolus mild erythema and swelling to left dorsum of foot, no increased warmth; patient also has significant dryness diffusely across her hands and feet.  Psychiatric: She has a normal mood and affect.  Nursing note and vitals reviewed.            UC Treatments / Results  Labs (all labs ordered are listed, but only abnormal results are displayed) Labs Reviewed - No data to display  EKG None Radiology No results found.  Procedures Procedures (including critical care time)  Medications Ordered in UC Medications - No data to display   Initial Impression / Assessment and Plan / UC Course  I have reviewed the triage vital signs and the nursing notes.  Pertinent labs & imaging results that were available during my care of the patient were reviewed by me and considered in my medical decision making (see chart for details).     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 Earlie Server, patient has plans to follow-up with wound care in 10 days.  Will wrap sores and continue to monitor.  Patient with chronic pain related to the sores not tolerating oxycodone, will switch to tramadol. Discussed strict return precautions. Patient verbalized understanding and is agreeable with plan.   Final Clinical Impressions(s) / UC Diagnoses   Final diagnoses:  Visit for wound check    ED Discharge Orders        Ordered    traMADol (ULTRAM) 50 MG tablet  Every 6 hours PRN     07/15/17 1821    mupirocin ointment (BACTROBAN) 2 %  2 times daily     07/15/17 1824       Controlled Substance Prescriptions [place] Controlled Substance Registry consulted? Yes, I have consulted the [place] Controlled Substances Registry for this patient, and feel the risk/benefit ratio today is favorable for proceeding with this prescription for a controlled substance.   Janith Lima, [place] 07/16/17 2148

## 2017-07-15 NOTE — ED Triage Notes (Signed)
Per family member, pt hx of polycythemia, states shes been taking medicine(anagrelide) for it and its causing sores on her feet and hands. Pt family also states the oxycodone given for pain makes her dizzy, constipated, pt wants to be swapped back to tramadol because she did not have those side effects. Pt recently dx with blood clots as well and is taking xarelto which makes the sores bleed.

## 2017-07-17 ENCOUNTER — Ambulatory Visit (HOSPITAL_COMMUNITY)
Admission: RE | Admit: 2017-07-17 | Discharge: 2017-07-17 | Disposition: A | Discharge status: Home / Self Care | Payer: Self-pay | Source: Ambulatory Visit | Attending: Internal Medicine | Admitting: Internal Medicine

## 2017-07-17 DIAGNOSIS — D45 Polycythemia vera: Secondary | ICD-10-CM | POA: Insufficient documentation

## 2017-07-17 DIAGNOSIS — R161 Splenomegaly, not elsewhere classified: Secondary | ICD-10-CM | POA: Insufficient documentation

## 2017-07-21 ENCOUNTER — Ambulatory Visit (HOSPITAL_COMMUNITY)
Admission: EM | Admit: 2017-07-21 | Discharge: 2017-07-21 | Disposition: A | Discharge status: Home / Self Care | Payer: Self-pay | Attending: Family Medicine | Admitting: Family Medicine

## 2017-07-21 ENCOUNTER — Inpatient Hospital Stay (HOSPITAL_BASED_OUTPATIENT_CLINIC_OR_DEPARTMENT_OTHER): Payer: Self-pay | Admitting: Internal Medicine

## 2017-07-21 ENCOUNTER — Encounter: Payer: Self-pay | Admitting: Internal Medicine

## 2017-07-21 ENCOUNTER — Telehealth: Payer: Self-pay

## 2017-07-21 ENCOUNTER — Encounter (HOSPITAL_COMMUNITY): Payer: Self-pay | Admitting: Family Medicine

## 2017-07-21 ENCOUNTER — Inpatient Hospital Stay: Payer: Self-pay

## 2017-07-21 ENCOUNTER — Telehealth: Payer: Self-pay | Admitting: Pharmacist

## 2017-07-21 VITALS — BP 112/71 | HR 60 | Temp 97.9°F | Resp 17 | Wt 111.9 lb

## 2017-07-21 DIAGNOSIS — L98499 Non-pressure chronic ulcer of skin of other sites with unspecified severity: Secondary | ICD-10-CM

## 2017-07-21 DIAGNOSIS — L97909 Non-pressure chronic ulcer of unspecified part of unspecified lower leg with unspecified severity: Secondary | ICD-10-CM

## 2017-07-21 DIAGNOSIS — D75839 Thrombocytosis, unspecified: Secondary | ICD-10-CM

## 2017-07-21 DIAGNOSIS — L97529 Non-pressure chronic ulcer of other part of left foot with unspecified severity: Secondary | ICD-10-CM

## 2017-07-21 DIAGNOSIS — D473 Essential (hemorrhagic) thrombocythemia: Secondary | ICD-10-CM

## 2017-07-21 DIAGNOSIS — D45 Polycythemia vera: Secondary | ICD-10-CM

## 2017-07-21 DIAGNOSIS — L97519 Non-pressure chronic ulcer of other part of right foot with unspecified severity: Secondary | ICD-10-CM

## 2017-07-21 DIAGNOSIS — M79609 Pain in unspecified limb: Secondary | ICD-10-CM

## 2017-07-21 DIAGNOSIS — R52 Pain, unspecified: Secondary | ICD-10-CM

## 2017-07-21 LAB — CBC WITH DIFFERENTIAL (CANCER CENTER ONLY)
BASOS ABS: 0.2 10*3/uL — AB (ref 0.0–0.1)
Basophils Relative: 1 %
EOS PCT: 1 %
Eosinophils Absolute: 0.2 10*3/uL (ref 0.0–0.5)
HCT: 38.7 % (ref 34.8–46.6)
HEMOGLOBIN: 12.3 g/dL (ref 11.6–15.9)
LYMPHS ABS: 1 10*3/uL (ref 0.9–3.3)
LYMPHS PCT: 6 %
MCH: 32.5 pg (ref 25.1–34.0)
MCHC: 31.8 g/dL (ref 31.5–36.0)
MCV: 102.3 fL — AB (ref 79.5–101.0)
Monocytes Absolute: 0.5 10*3/uL (ref 0.1–0.9)
Monocytes Relative: 3 %
NEUTROS ABS: 14.8 10*3/uL — AB (ref 1.5–6.5)
NEUTROS PCT: 89 %
PLATELETS: 1133 10*3/uL — AB (ref 145–400)
RBC: 3.79 MIL/uL (ref 3.70–5.45)
RDW: 22.1 % — ABNORMAL HIGH (ref 11.2–14.5)
WBC: 16.8 10*3/uL — AB (ref 3.9–10.3)

## 2017-07-21 MED ORDER — RUXOLITINIB PHOSPHATE 5 MG PO TABS: 5.0000 mg | ORAL_TABLET | Freq: Two times a day (BID) | ORAL | 3 refills | Status: DC

## 2017-07-21 MED ORDER — OXYCODONE HCL 10 MG PO TABS: 10.0000 mg | ORAL_TABLET | ORAL | 0 refills | Status: DC | PRN

## 2017-07-21 NOTE — Discharge Instructions (Addendum)
You will need to get any further pain medicine refills from Dr. Earlie Server

## 2017-07-21 NOTE — Telephone Encounter (Signed)
Oral Oncology Pharmacist Encounter  Received new prescription for Jakafi (ruxolitinib) for the treatment of myeloproliferative disorder (essential thrombocythemia, polycythemia vera), planned duration until disease progression or unacceptable toxicity.  Labs from Epic assessed, okay for treatment. Noted SCr=1.24, patient noted with weight = 50.8 kg, est CrCl~ 30 mL/min, manufacturer recommends reduced dose at initiation and renal dysfunction, discussed with MD Jakafi to be started at 5 mg twice daily  Current medication list in Epic reviewed, DDI with Jakafi and bisoprolol identified:  Category C interaction: Ruxolitinib may enhance the bradycardic effect of bradycardia causing agents.  HR is in Epic consistently in the 60s, risk of decreased heart rate will be discussed with MD and patient.  Noted patient without prescription insurance coverage. Oral oncology clinic will work with patient and son to try to obtain medication through compassionate use through the manufacturer.  Oral Oncology Clinic will continue to follow for medication acquisition, initial counseling and start date.  Johny Drilling, PharmD, BCPS, BCOP 07/21/2017 1:01 PM Oral Oncology Clinic [phone]

## 2017-07-21 NOTE — Telephone Encounter (Signed)
Mailing calender of up coming appointment. Per 4/16 los. Patient requested a call to give him his next appointment time, and date while he was in the office. Per 4/16 los

## 2017-07-21 NOTE — ED Triage Notes (Signed)
Pt here for continued leg pain due to ulcers which are caused by medication for polycythemia. She has been using the tramadol and it helps her pain and relax and increased appetite. She is out of tramadol. She also went to her oncologist today and her platelets were extremely elevated.

## 2017-07-21 NOTE — Progress Notes (Signed)
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 daily  CURRENT THERAPY: Anagrelide 1 mg p.o. twice daily  INTERVAL HISTORY: Misty Herrera 72 y.o. female returns to the clinic today for returns to the clinic today for follow-up visit accompanied by her son.  The patient continues to complain of pain in her back as well as in the upper and lower extremities.  She was a started on pain medication with Percocet 10/325 mg every 4 hours as needed for pain but the patient mentioned that she could not tolerate the Percocet.  She was seen at 1 of the urgent care center and was given prescription for tramadol.  She continues to have the skin ulcers on the hands and leg.  She is scheduled to see the wound clinic in 2 days.  She has no current nausea or vomiting.  No diarrhea or constipation.  She is tolerating her current treatment with anagrelide fairly well.  She is here today for evaluation and repeat blood work.  MEDICAL HISTORY: Past Medical History:  Diagnosis Date  . DVT (deep venous thrombosis) ([place])   . Hypertension   . Polycythemia     ALLERGIES:  has No Known Allergies.  MEDICATIONS:  Current Outpatient Medications  Medication Sig Dispense Refill  . amLODipine-valsartan (EXFORGE) 10-160 MG tablet Take 1 tablet by mouth daily.    Marland Kitchen amLODIPine-Valsartan-HCTZ (EXFORGE HCT) 10-160-12.5 MG TABS Take 1 tablet by mouth every evening.     Marland Kitchen anagrelide (AGRYLIN) 1 MG capsule Take 1 capsule (1 mg total) by mouth 2 (two) times daily. 60 capsule 2  . folic acid (FOLVITE) 737 MCG tablet Take 400 mcg by mouth daily.    . hydroxyurea (HYDREA) 500 MG capsule Take 1,000 mg by mouth  daily. May take with food to minimize GI side effects.     Marland Kitchen levothyroxine (SYNTHROID, LEVOTHROID) 50 MCG tablet Take 50 mcg by mouth daily before breakfast.    . mupirocin ointment (BACTROBAN) 2 % Apply 1 application topically 2 (two) times daily. 30 g 0  . prochlorperazine (COMPAZINE) 10 MG tablet Take 1 tablet (10 mg total) by mouth every 6 (six) hours as needed for nausea or vomiting. 30 tablet 0  . Rivaroxaban 15 & 20 MG TBPK Take as directed on package: Start with one 15mg  tablet by mouth twice a day with food. On Day 22, switch to one 20mg  tablet once a day with food. 51 each 0  . traMADol (ULTRAM) 50 MG tablet Take 2 tablets (100 mg total) by mouth every 6 (six) hours as needed for up to 14 days. 56 tablet 0  . valsartan-hydrochlorothiazide (DIOVAN-HCT) 160-12.5 MG tablet Take 1 tablet by mouth daily.    Marland Kitchen aspirin 81 MG chewable tablet Chew 81 mg by mouth daily.    . Oxycodone HCl 10 MG TABS Take 10 mg by mouth as needed.  0  . oxyCODONE-acetaminophen (PERCOCET) 10-325 MG tablet Take 1 tablet by mouth every 6 (six) hours as needed for pain. (Patient not taking: Reported on 07/21/2017) 120 tablet 0  . UNABLE TO FIND Take 5 mg by mouth daily. Bisoprolol Hemifumarate-beta blocker     No current facility-administered medications for this visit.  SURGICAL HISTORY: History reviewed. No pertinent surgical history.  REVIEW OF SYSTEMS:  Constitutional: positive for fatigue Eyes: negative Ears, nose, mouth, throat, and face: negative Respiratory: negative Cardiovascular: negative Gastrointestinal: positive for nausea Genitourinary:negative Integument/breast: positive for dryness, pruritus, rash and skin lesion(s) Hematologic/lymphatic: negative Musculoskeletal:negative Neurological: negative Behavioral/Psych: negative Endocrine: negative Allergic/Immunologic: negative   PHYSICAL EXAMINATION: General appearance: alert, cooperative, fatigued and no distress Head: Normocephalic, without  obvious abnormality, atraumatic Neck: no adenopathy, no JVD, supple, symmetrical, trachea midline and thyroid not enlarged, symmetric, no tenderness/mass/nodules Lymph nodes: Cervical, supraclavicular, and axillary nodes normal. Resp: clear to auscultation bilaterally Back: symmetric, no curvature. ROM normal. No CVA tenderness. Cardio: regular rate and rhythm, S1, S2 normal, no murmur, click, rub or gallop GI: soft, non-tender; bowel sounds normal; no masses,  no organomegaly Extremities: extremities normal, atraumatic, no cyanosis or edema and Multiple skin ulcers Neurologic: Alert and oriented X 3, normal strength and tone. Normal symmetric reflexes. Normal coordination and gait  ECOG PERFORMANCE STATUS: 1 - Symptomatic but completely ambulatory  Blood pressure 112/71, pulse 60, temperature 97.9 F (36.6 C), temperature source Oral, resp. rate 17, weight 111 lb 14.4 oz (50.8 kg), SpO2 98 %.  LABORATORY DATA: Lab Results  Component Value Date   WBC 16.8 (H) 07/21/2017   HGB 12.3 07/05/2017   HCT 38.7 07/21/2017   MCV 102.3 (H) 07/21/2017   PLT 1,133 (HH) 07/21/2017      Chemistry      Component Value Date/Time   NA 137 07/09/2017 1315   K 4.8 07/09/2017 1315   CL 104 07/09/2017 1315   CO2 27 07/09/2017 1315   BUN 41 (H) 07/09/2017 1315   CREATININE 1.24 (H) 07/09/2017 1315      Component Value Date/Time   CALCIUM 9.0 07/09/2017 1315   ALKPHOS 68 07/09/2017 1315   AST 15 07/09/2017 1315   ALT 8 07/09/2017 1315   BILITOT 0.6 07/09/2017 1315       RADIOGRAPHIC STUDIES: US Abdomen Complete  Result Date: 07/17/2017 CLINICAL DATA:  Myeloproliferative disorder. Rule out splenomegaly. Hypertension and polycythemia EXAM: ABDOMEN ULTRASOUND COMPLETE COMPARISON:  None. FINDINGS: Gallbladder: No gallstones or wall thickening visualized. No sonographic Murphy sign noted by sonographer. Common bile duct: Diameter: 5 mm Liver: No focal lesion identified. Within normal limits in  parenchymal echogenicity. Portal vein is patent on color Doppler imaging with normal direction of blood flow towards the liver. IVC: No abnormality visualized. Pancreas: Visualized portion unremarkable. Spleen: Enlarged measuring 14.2 cm in length (volume of 628 cc) Right Kidney: Length: 9.1 cm. Echogenicity within normal limits. No mass or hydronephrosis visualized. Left Kidney: Length: 9.8 cm. Echogenicity within normal limits. 1.5 cm cyst. No suspicious mass or hydronephrosis visualized. Abdominal aorta: No aneurysm visualized. Other findings: None. IMPRESSION: 1. Splenomegaly.  14.2 cm in length and volume of 628 cc. 2. Remainder of the abdomen ultrasound is unremarkable, as detailed above. Electronically Signed   By: Franki Cabot M.D.   On: 07/17/2017 13:16    ASSESSMENT AND PLAN: This is a very pleasant 72 years old white female with myeloproliferative disorders including polycythemia vera, essential thrombocythemia and splenomegaly.  She has positive Jak 2 mutation.  This was initially diagnosed in 2001 status post several years of treatment with hydroxyurea resulting in a lot of nonhealing skin ulcers. Her treatment with hydroxyurea was discontinued. The patient is currently on treatment with anagrelide 1 mg p.o. twice daily.  Platelets count are still elevated today.  I recommended for the patient to increase  the dose of anagrelide to 1 mg p.o. 3 times daily. We will also consider the patient for treatment with Jakafi 5 mg p.o. twice daily.  We will work with her insurance company or the [facility] to get her the medication. For pain management she was unable to tolerate her previous treatment with Percocet.  She is currently on tramadol from the urgent care center.  I explained to the patient that I would not be able to refill her medication until she returned back to the Percocet for disposal or completion of 2 more weeks when it is time for refill. For the upper and lower extremities  nonhealing ulcers, the patient was referred to the wound clinic and she is expected to see them in 2 days. We will repeat her blood work next week and the patient will come back for follow-up visit in 2 weeks for reevaluation and repeat blood work again. She was advised to call if she has any concerning symptoms in the interval. The patient voices understanding of current disease status and treatment options and is in agreement with the current care plan.  All questions were answered. The patient knows to call the clinic with any problems, questions or concerns. We can certainly see the patient much sooner if necessary.  I spent 15 minutes counseling the patient face to face. The total time spent in the appointment was 25 minutes.  Disclaimer: This note was dictated with voice recognition software. Similar sounding words can inadvertently be transcribed and may not be corrected upon review.

## 2017-07-21 NOTE — Telephone Encounter (Signed)
Oral Chemotherapy Pharmacist Encounter   I spoke with patient and son in exam room for overview of: Jakafi. Patient does not speak English.  Son translated counseling session. Patient son is primary point of contact.  Counseled patient on administration, dosing, side effects, monitoring, drug-food interactions, safe handling, storage, and disposal.  Patient will take Jakafi 5 mg tablets, 1 tablet by mouth 2 times daily, with or without food. Patient knows to avoid grapefruit and grapefruit juice. Jakafi start date: TBD, pending medication acquisition  Side effects include but not limited to: decreased blood counts, increased lipid profile, increased cholesterol, dizziness, and headacahe.    Reviewed with patient importance of keeping a medication schedule and plan for any missed doses.  Patient voiced understanding and appreciation.   All questions answered. Medication reconciliation performed and medication/allergy list updated. I discussed interaction with patient's beta-blocker and risk for decrease in heart rate. Patient son will call me later today or tomorrow for us to go through all the medications that patient is currently taking so that I can update medication list.  Oral Oncology Patient Advocate met with patient and son to discuss manufacture application for compassionate use.   Patient knows to call the office with questions or concerns. Oral Oncology Clinic will continue to follow.  Thank you,  Jesse Mack, PharmD, BCPS, BCOP 07/21/2017   1:10 PM Oral Oncology Clinic [phone] 

## 2017-07-21 NOTE — Telephone Encounter (Signed)
Printed  calender of upcoming appointment. Per 4/16 los and mailed information to patient.

## 2017-07-21 NOTE — ED Provider Notes (Signed)
[facility]   [id number] 07/21/17 Arrival Time: 8101   SUBJECTIVE:  Misty Herrera is a 72 y.o. female who presents to the urgent care with complaint of diffuse pain in hands and feet.  Patient has thrombocytosis with history of bone pain and clotting disorder.  She is having ulcers on her feet and hands as photographed in the previous visit.  She is coming by her son who explains that only oxycodone has relieved her pain.  She has an appointment with Dr. Earlie Server in 2 weeks.  They just want pain medicine for 2 weeks.  This been ongoing problem for over a decade.     Past Medical History:  Diagnosis Date  . DVT (deep venous thrombosis) ([place])   . Hypertension   . Polycythemia    History reviewed. No pertinent family history. Social History   Socioeconomic History  . Marital status: Single    Spouse name: Not on file  . Number of children: Not on file  . Years of education: Not on file  . Highest education level: Not on file  Occupational History  . Not on file  Social Needs  . Financial resource strain: Not on file  . Food insecurity:    Worry: Not on file    Inability: Not on file  . Transportation needs:    Medical: Not on file    Non-medical: Not on file  Tobacco Use  . Smoking status: Never Smoker  . Smokeless tobacco: Never Used  Substance and Sexual Activity  . Alcohol use: Not on file  . Drug use: Not on file  . Sexual activity: Not on file  Lifestyle  . Physical activity:    Days per week: Not on file    Minutes per session: Not on file  . Stress: Not on file  Relationships  . Social connections:    Talks on phone: Not on file    Gets together: Not on file    Attends religious service: Not on file    Active member of club or organization: Not on file    Attends meetings of clubs or organizations: Not on file    Relationship status: Not on file  . Intimate partner violence:    Fear of current or ex partner: Not on file    Emotionally  abused: Not on file    Physically abused: Not on file    Forced sexual activity: Not on file  Other Topics Concern  . Not on file  Social History Narrative  . Not on file   No outpatient medications have been marked as taking for the 07/21/17 encounter [facility] Encounter).   No Known Allergies    ROS: As per HPI, remainder of ROS negative.   OBJECTIVE:   Vitals:   07/21/17 1332  BP: 119/70  Pulse: 62  Resp: 18  Temp: 98.4 F (36.9 C)  SpO2: 98%     General appearance: alert; no distress Eyes: PERRL; EOMI; conjunctiva normal HENT: normocephalic; atraumatic;  oral mucosa normal Neck: supple Skin: warm and dry; multiple ulcers on hands and feet with dry exfoliating skin. Neurologic: normal gait; grossly normal Psychological: alert and cooperative; normal mood and affect      Labs:  Results for orders placed or performed in visit on 07/21/17  CBC with Differential (Cancer Center Only)  Result Value Ref Range   WBC Count 16.8 (H) 3.9 - 10.3 K/uL   RBC 3.79 3.70 - 5.45 MIL/uL   Hemoglobin 12.3 11.6 -  15.9 g/dL   HCT 38.7 34.8 - 46.6 %   MCV 102.3 (H) 79.5 - 101.0 fL   MCH 32.5 25.1 - 34.0 pg   MCHC 31.8 31.5 - 36.0 g/dL   RDW 22.1 (H) 11.2 - 14.5 %   Platelet Count 1,133 (HH) 145 - 400 K/uL   Smear Review      OC META AND MYELOCYTE, FEW TEARDROPS, OVALOCYTES, LARGE PLATELETS, MOD RBC FRAGMENTS   Neutrophils Relative % 89 %   Neutro Abs 14.8 (H) 1.5 - 6.5 K/uL   Lymphocytes Relative 6 %   Lymphs Abs 1.0 0.9 - 3.3 K/uL   Monocytes Relative 3 %   Monocytes Absolute 0.5 0.1 - 0.9 K/uL   Eosinophils Relative 1 %   Eosinophils Absolute 0.2 0.0 - 0.5 K/uL   Basophils Relative 1 %   Basophils Absolute 0.2 (H) 0.0 - 0.1 K/uL    Labs Reviewed - No data to display  No results found.     ASSESSMENT & PLAN:  1. Thrombocytosis ([place])   2. Skin ulcers of both feet ([facility])    You will need to get any further pain medicine refills from Dr. Earlie Server Meds  ordered this encounter  Medications  . Oxycodone HCl 10 MG TABS    Sig: Take 1 tablet (10 mg total) by mouth as needed.    Dispense:  60 tablet    Refill:  0    Reviewed expectations re: course of current medical issues. Questions answered. Outlined signs and symptoms indicating need for more acute intervention. Patient verbalized understanding. After Visit Summary given.    Procedures:      Robyn Haber, MD 07/21/17 1409

## 2017-07-22 ENCOUNTER — Telehealth: Payer: Self-pay | Admitting: Pharmacy Technician

## 2017-07-22 NOTE — Telephone Encounter (Signed)
Oral Oncology Patient Advocate Encounter  Met patient and son in exam room to complete application for [facility] Patient [place] in an effort to reduce patient's out of pocket expense for Gilotrif to $0.    Application completed and faxed to [phone].  [employer] patient assistance phone number for follow up is [phone].   This encounter will be updated until final determination.   Fabio Asa. Melynda Keller, [place] Patient [facility] [phone] 07/22/2017 2:23 PM

## 2017-07-23 ENCOUNTER — Encounter (HOSPITAL_BASED_OUTPATIENT_CLINIC_OR_DEPARTMENT_OTHER): Payer: Self-pay | Attending: Internal Medicine

## 2017-07-23 DIAGNOSIS — I10 Essential (primary) hypertension: Secondary | ICD-10-CM | POA: Insufficient documentation

## 2017-07-23 DIAGNOSIS — E039 Hypothyroidism, unspecified: Secondary | ICD-10-CM | POA: Insufficient documentation

## 2017-07-23 DIAGNOSIS — L98498 Non-pressure chronic ulcer of skin of other sites with other specified severity: Secondary | ICD-10-CM | POA: Insufficient documentation

## 2017-07-23 DIAGNOSIS — D45 Polycythemia vera: Secondary | ICD-10-CM | POA: Insufficient documentation

## 2017-07-23 DIAGNOSIS — D473 Essential (hemorrhagic) thrombocythemia: Secondary | ICD-10-CM | POA: Insufficient documentation

## 2017-07-23 DIAGNOSIS — L97529 Non-pressure chronic ulcer of other part of left foot with unspecified severity: Secondary | ICD-10-CM | POA: Insufficient documentation

## 2017-07-23 LAB — JAK2 (INCLUDING V617F AND EXON 12), MPL,& CALR W/RFL MPN PANEL (NGS)

## 2017-07-28 ENCOUNTER — Telehealth: Payer: Self-pay | Admitting: Internal Medicine

## 2017-07-28 ENCOUNTER — Other Ambulatory Visit: Payer: Self-pay

## 2017-07-28 NOTE — Telephone Encounter (Signed)
Patient called to reschedule  °

## 2017-07-29 ENCOUNTER — Telehealth: Payer: Self-pay | Admitting: *Deleted

## 2017-07-29 ENCOUNTER — Inpatient Hospital Stay: Payer: Self-pay

## 2017-07-29 DIAGNOSIS — D473 Essential (hemorrhagic) thrombocythemia: Secondary | ICD-10-CM

## 2017-07-29 LAB — CBC WITH DIFFERENTIAL (CANCER CENTER ONLY)
BASOS ABS: 0.1 10*3/uL (ref 0.0–0.1)
Basophils Relative: 0 %
EOS PCT: 1 %
Eosinophils Absolute: 0.2 10*3/uL (ref 0.0–0.5)
HEMATOCRIT: 38.8 % (ref 34.8–46.6)
HEMOGLOBIN: 12 g/dL (ref 11.6–15.9)
LYMPHS PCT: 7 %
Lymphs Abs: 1.4 10*3/uL (ref 0.9–3.3)
MCH: 31.4 pg (ref 25.1–34.0)
MCHC: 30.9 g/dL — AB (ref 31.5–36.0)
MCV: 101.6 fL — AB (ref 79.5–101.0)
MONO ABS: 0.6 10*3/uL (ref 0.1–0.9)
MONOS PCT: 3 %
NEUTROS ABS: 18.6 10*3/uL — AB (ref 1.5–6.5)
Neutrophils Relative %: 89 %
Platelet Count: 1273 10*3/uL (ref 145–400)
RBC: 3.82 MIL/uL (ref 3.70–5.45)
RDW: 21.7 % — AB (ref 11.2–14.5)
WBC Count: 20.9 10*3/uL — ABNORMAL HIGH (ref 3.9–10.3)
nRBC: 1 /100 WBC — ABNORMAL HIGH

## 2017-07-29 NOTE — Telephone Encounter (Signed)
Lab results brought to Desk RN. Platelets elevated 1273. Reviewed with NP as MD out of office. Called pt to confirm medication and dose. Son confirmed pt is currently taking Anagrelide 1mg  TID, he received a call from [place] regarding paperwork to be signed, states he can come in tomorrow to sign paperwork.  Discussed with Son we will discuss with MD and call back with additional instructions.  MD progress note to consider Jakaki 5 mg BID.  Message routed to MD/Pharmacy for further clarification.

## 2017-07-30 ENCOUNTER — Telehealth: Payer: Self-pay

## 2017-07-30 NOTE — Telephone Encounter (Signed)
Patient's son called concerned about his mother's blood results.  Explained Dr. Julien Nordmann is aware and working on a change in her medication regimen.  The pharmacy will call when the paperwork is ready for him to sign. He verbalized an understanding.

## 2017-07-30 NOTE — Telephone Encounter (Signed)
Oral Oncology Patient Advocate Encounter  Received notification from [facility] Patient Assistance program that patient has been successfully enrolled into their program to receive Jakafi from the manufacturer at $0 out of pocket until 07/29/2018.   I called and spoke with patient's son on 07/28/17 and informed him of the approval.    The medication will shipped from [place] directly to the patient's home.  I provided Joe, the patient's son, with the phone number to [facility] and suggested that he contact them to arrange for shipment of the medication.   [employer] does require an additional signature from the patient.  I will obtain that signature from the patient during her visit on 08/04/17 as we have 90 days to submit it to the program.    Patient knows to call the office with questions or concerns.  Oral Oncology Clinic will continue to follow.  Gilmore Laroche, CPhT, [place] Oral Oncology Patient Advocate [phone] 07/30/2017 1:40 PM

## 2017-08-04 ENCOUNTER — Encounter: Payer: Self-pay | Admitting: Internal Medicine

## 2017-08-04 ENCOUNTER — Other Ambulatory Visit: Payer: Self-pay

## 2017-08-04 ENCOUNTER — Telehealth: Payer: Self-pay

## 2017-08-04 ENCOUNTER — Inpatient Hospital Stay: Payer: Self-pay

## 2017-08-04 ENCOUNTER — Inpatient Hospital Stay (HOSPITAL_BASED_OUTPATIENT_CLINIC_OR_DEPARTMENT_OTHER): Payer: Self-pay | Admitting: Internal Medicine

## 2017-08-04 VITALS — BP 116/74 | HR 59 | Temp 98.1°F | Resp 18 | Wt 113.5 lb

## 2017-08-04 DIAGNOSIS — D473 Essential (hemorrhagic) thrombocythemia: Secondary | ICD-10-CM

## 2017-08-04 DIAGNOSIS — R52 Pain, unspecified: Secondary | ICD-10-CM

## 2017-08-04 DIAGNOSIS — D45 Polycythemia vera: Secondary | ICD-10-CM

## 2017-08-04 LAB — CBC WITH DIFFERENTIAL (CANCER CENTER ONLY)
BASOS PCT: 2 %
Basophils Absolute: 0.3 10*3/uL — ABNORMAL HIGH (ref 0.0–0.1)
Eosinophils Absolute: 0.2 10*3/uL (ref 0.0–0.5)
Eosinophils Relative: 2 %
HCT: 40.4 % (ref 34.8–46.6)
HEMOGLOBIN: 12.4 g/dL (ref 11.6–15.9)
LYMPHS ABS: 1.9 10*3/uL (ref 0.9–3.3)
Lymphocytes Relative: 14 %
MCH: 30 pg (ref 25.1–34.0)
MCHC: 30.7 g/dL — ABNORMAL LOW (ref 31.5–36.0)
MCV: 97.7 fL (ref 79.5–101.0)
MONOS PCT: 4 %
Monocytes Absolute: 0.5 10*3/uL (ref 0.1–0.9)
NEUTROS ABS: 10.5 10*3/uL — AB (ref 1.5–6.5)
NEUTROS PCT: 78 %
Platelet Count: 1149 10*3/uL (ref 145–400)
RBC: 4.14 MIL/uL (ref 3.70–5.45)
RDW: 23.4 % — ABNORMAL HIGH (ref 11.2–14.5)
WBC: 13.3 10*3/uL — AB (ref 3.9–10.3)

## 2017-08-04 LAB — CMP (CANCER CENTER ONLY)
ALT: <6 U/L (ref 0–55)
ANION GAP: 5 (ref 3–11)
AST: 20 U/L (ref 5–34)
Albumin: 3.8 g/dL (ref 3.5–5.0)
Alkaline Phosphatase: 72 U/L (ref 40–150)
BUN: 32 mg/dL — ABNORMAL HIGH (ref 7–26)
CHLORIDE: 108 mmol/L (ref 98–109)
CO2: 26 mmol/L (ref 22–29)
Calcium: 8.9 mg/dL (ref 8.4–10.4)
Creatinine: 1.12 mg/dL — ABNORMAL HIGH (ref 0.60–1.10)
GFR, EST AFRICAN AMERICAN: 55 mL/min — AB (ref >60)
GFR, EST NON AFRICAN AMERICAN: 47 mL/min — AB (ref >60)
Glucose, Bld: 97 mg/dL (ref 70–140)
Potassium: 4.6 mmol/L (ref 3.5–5.1)
SODIUM: 139 mmol/L (ref 136–145)
Total Bilirubin: 0.5 mg/dL (ref 0.2–1.2)
Total Protein: 7.4 g/dL (ref 6.4–8.3)

## 2017-08-04 LAB — LACTATE DEHYDROGENASE: LDH: 585 U/L — AB (ref 125–245)

## 2017-08-04 MED ORDER — OXYCODONE HCL 10 MG PO TABS: 10.0000 mg | ORAL_TABLET | Freq: Four times a day (QID) | ORAL | 0 refills | Status: DC | PRN

## 2017-08-04 MED ORDER — ANAGRELIDE HCL 1 MG PO CAPS: 1.0000 mg | ORAL_CAPSULE | Freq: Two times a day (BID) | ORAL | 1 refills | Status: DC

## 2017-08-04 MED FILL — oxyCODONE HCL 10 MG TABS: 10 | 15 days supply | Qty: 60 | Fill #0

## 2017-08-04 MED FILL — ANAGRELIDE HCL 1 MG CAPSULE: 1 | 30 days supply | Qty: 60 | Fill #0

## 2017-08-04 NOTE — Telephone Encounter (Signed)
Error opening  

## 2017-08-04 NOTE — Progress Notes (Signed)
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 daily  CURRENT THERAPY: 1) Jakafi (Ruxolitinib) 5 mg p.o. twice daily started July 29, 2017. 2) Anagrelide 1 mg p.o. 2 times daily.  INTERVAL HISTORY: Misty Herrera 72 y.o. female returns to the clinic today for follow-up visit accompanied by her son.  The patient is feeling a little bit better today.  The skin ulcers and rash are slightly improved.  She was seen at the wound clinic.  The patient was also started on treatment with Jakafi (Ruxolitinib) a few days ago 5 mg p.o. twice daily.  She denied having any current chest pain, shortness breath, cough or hemoptysis.  She denied having any fever or chills.  She has no nausea, vomiting, diarrhea but has occasional constipation.  She is requesting refill of her pain medication.  She is here today for evaluation and repeat blood work.  MEDICAL HISTORY: Past Medical History:  Diagnosis Date  . DVT (deep venous thrombosis) ([facility])   . Hypertension   . Polycythemia     ALLERGIES:  has No Known Allergies.  MEDICATIONS:  Current Outpatient Medications  Medication Sig Dispense Refill  . amLODIPine-Valsartan-HCTZ (EXFORGE HCT) 10-160-12.5 MG TABS Take 1 tablet by mouth every evening.     Marland Kitchen aspirin 81 MG chewable tablet Chew 81 mg by mouth daily.    . folic acid (FOLVITE) 671 MCG tablet Take 400 mcg by mouth daily.    Marland Kitchen levothyroxine (SYNTHROID, LEVOTHROID) 50 MCG tablet Take 50 mcg by mouth daily before breakfast.    . mupirocin ointment (BACTROBAN) 2 % Apply 1 application topically 2 (two) times daily. 30 g 0  . Oxycodone HCl 10 MG TABS Take 1 tablet (10 mg total) by mouth as  needed. 60 tablet 0  . prochlorperazine (COMPAZINE) 10 MG tablet Take 1 tablet (10 mg total) by mouth every 6 (six) hours as needed for nausea or vomiting. 30 tablet 0  . Rivaroxaban 15 & 20 MG TBPK Take as directed on package: Start with one 15mg  tablet by mouth twice a day with food. On Day 22, switch to one 20mg  tablet once a day with food. 51 each 0  . ruxolitinib phosphate (JAKAFI) 5 MG tablet Take 1 tablet (5 mg total) by mouth 2 (two) times daily. 60 tablet 3  . UNABLE TO FIND Take 5 mg by mouth daily. Bisoprolol Hemifumarate-beta blocker     No current facility-administered medications for this visit.     SURGICAL HISTORY: No past surgical history on file.  REVIEW OF SYSTEMS:  A comprehensive review of systems was negative except for: Constitutional: positive for fatigue Integument/breast: positive for pruritus, rash and skin lesion(s)   PHYSICAL EXAMINATION: General appearance: alert, cooperative, fatigued and no distress Head: Normocephalic, without obvious abnormality, atraumatic Neck: no adenopathy, no JVD, supple, symmetrical, trachea midline and thyroid not enlarged, symmetric, no tenderness/mass/nodules Lymph nodes: Cervical, supraclavicular, and axillary nodes normal. Resp: clear to auscultation bilaterally Back: symmetric, no curvature. ROM normal. No CVA tenderness. Cardio: regular rate and rhythm, S1, S2 normal, no murmur, click, rub or gallop GI: soft, non-tender; bowel sounds normal; no masses,  no organomegaly Extremities: extremities normal, atraumatic, no cyanosis or edema and Several skin ulcers and  rash.  ECOG PERFORMANCE STATUS: 1 - Symptomatic but completely ambulatory  Blood pressure 116/74, pulse (!) 59, temperature 98.1 F (36.7 C), temperature source Oral, resp. rate 18, weight 113 lb 8 oz (51.5 kg), SpO2 99 %.  LABORATORY DATA: Lab Results  Component Value Date   WBC 13.3 (H) 08/04/2017   HGB 12.4 08/04/2017   HCT 40.4 08/04/2017   MCV 97.7  08/04/2017   PLT 1,149 (HH) 08/04/2017      Chemistry      Component Value Date/Time   NA 137 07/09/2017 1315   K 4.8 07/09/2017 1315   CL 104 07/09/2017 1315   CO2 27 07/09/2017 1315   BUN 41 (H) 07/09/2017 1315   CREATININE 1.24 (H) 07/09/2017 1315      Component Value Date/Time   CALCIUM 9.0 07/09/2017 1315   ALKPHOS 68 07/09/2017 1315   AST 15 07/09/2017 1315   ALT 8 07/09/2017 1315   BILITOT 0.6 07/09/2017 1315       RADIOGRAPHIC STUDIES: US Abdomen Complete  Result Date: 07/17/2017 CLINICAL DATA:  Myeloproliferative disorder. Rule out splenomegaly. Hypertension and polycythemia EXAM: ABDOMEN ULTRASOUND COMPLETE COMPARISON:  None. FINDINGS: Gallbladder: No gallstones or wall thickening visualized. No sonographic Murphy sign noted by sonographer. Common bile duct: Diameter: 5 mm Liver: No focal lesion identified. Within normal limits in parenchymal echogenicity. Portal vein is patent on color Doppler imaging with normal direction of blood flow towards the liver. IVC: No abnormality visualized. Pancreas: Visualized portion unremarkable. Spleen: Enlarged measuring 14.2 cm in length (volume of 628 cc) Right Kidney: Length: 9.1 cm. Echogenicity within normal limits. No mass or hydronephrosis visualized. Left Kidney: Length: 9.8 cm. Echogenicity within normal limits. 1.5 cm cyst. No suspicious mass or hydronephrosis visualized. Abdominal aorta: No aneurysm visualized. Other findings: None. IMPRESSION: 1. Splenomegaly.  14.2 cm in length and volume of 628 cc. 2. Remainder of the abdomen ultrasound is unremarkable, as detailed above. Electronically Signed   By: Franki Cabot M.D.   On: 07/17/2017 13:16    ASSESSMENT AND PLAN: This is a very pleasant 72 years old white female with myeloproliferative disorders including polycythemia vera, essential thrombocythemia and splenomegaly.  She has positive Jak 2 mutation.  This was initially diagnosed in 2001 status post several years of treatment  with hydroxyurea resulting in a lot of nonhealing skin ulcers. Her treatment with hydroxyurea was discontinued. The patient was a started on anagrelide up to 1 mg p.o. 3 times daily but she continues to have worsening of her platelets count.  She was recently started on treatment with Jakafi (Ruxolitinib) for 4 days and her platelets count as well as total white blood count start declining hopefully as a response to this treatment. I recommended for the patient to continue her treatment with Jakafi (Ruxolitinib) as prescribed.  I will also reduced her anagrelide to 1 mg p.o. twice daily.  We will continue to monitor her blood count closely with repeat CBC, comprehensive metabolic panel and LDH in 2 weeks. For pain management, I gave the patient refill of oxycodone 10 mg p.o. 4 times daily as needed for pain. She was advised to call immediately if she has any concerning symptoms in the interval. The patient voices understanding of current disease status and treatment options and is in agreement with the current care plan.  All questions were answered. The patient knows to call the clinic with any problems, questions or concerns. We can certainly see the patient much sooner if necessary.  I spent  10 minutes counseling the patient face to face. The total time spent in the appointment was 15 minutes.  Disclaimer: This note was dictated with voice recognition software. Similar sounding words can inadvertently be transcribed and may not be corrected upon review.

## 2017-08-06 ENCOUNTER — Encounter (HOSPITAL_BASED_OUTPATIENT_CLINIC_OR_DEPARTMENT_OTHER): Payer: Self-pay | Attending: Internal Medicine

## 2017-08-07 ENCOUNTER — Telehealth: Payer: Self-pay | Admitting: Medical Oncology

## 2017-08-07 NOTE — Telephone Encounter (Signed)
Son Misty Herrera, called to report pt last rivaroxaban tablet is tomorrow, and her refill will arrive in 2 days. I offered to call in refill and he said it is too expensive and her refill is coming from her son in two days . He said,  "I do not think it will be a problem for her to go a day without the medicine."  I told him she needs to stay on the rivaroxaban medicine . I offered to call in 2 tablets to the pharmacy and he said no. He said he will have medicine shipped overnight.

## 2017-08-21 ENCOUNTER — Other Ambulatory Visit: Payer: Self-pay | Admitting: *Deleted

## 2017-08-21 ENCOUNTER — Telehealth: Payer: Self-pay | Admitting: Internal Medicine

## 2017-08-21 ENCOUNTER — Inpatient Hospital Stay: Payer: Self-pay | Attending: Internal Medicine | Admitting: Internal Medicine

## 2017-08-21 ENCOUNTER — Inpatient Hospital Stay: Payer: Self-pay

## 2017-08-21 ENCOUNTER — Encounter: Payer: Self-pay | Admitting: Internal Medicine

## 2017-08-21 VITALS — BP 106/76 | HR 65 | Temp 97.9°F | Resp 18 | Ht 61.5 in | Wt 115.7 lb

## 2017-08-21 DIAGNOSIS — D473 Essential (hemorrhagic) thrombocythemia: Secondary | ICD-10-CM

## 2017-08-21 DIAGNOSIS — D45 Polycythemia vera: Secondary | ICD-10-CM | POA: Insufficient documentation

## 2017-08-21 DIAGNOSIS — Z7982 Long term (current) use of aspirin: Secondary | ICD-10-CM | POA: Insufficient documentation

## 2017-08-21 DIAGNOSIS — I10 Essential (primary) hypertension: Secondary | ICD-10-CM | POA: Insufficient documentation

## 2017-08-21 DIAGNOSIS — R161 Splenomegaly, not elsewhere classified: Secondary | ICD-10-CM | POA: Insufficient documentation

## 2017-08-21 DIAGNOSIS — Z86718 Personal history of other venous thrombosis and embolism: Secondary | ICD-10-CM | POA: Insufficient documentation

## 2017-08-21 LAB — LACTATE DEHYDROGENASE: LDH: 348 U/L — AB (ref 125–245)

## 2017-08-21 LAB — CMP (CANCER CENTER ONLY)
ALT: 9 U/L (ref 0–55)
AST: 21 U/L (ref 5–34)
Albumin: 3.8 g/dL (ref 3.5–5.0)
Alkaline Phosphatase: 77 U/L (ref 40–150)
Anion gap: 5 (ref 3–11)
BUN: 39 mg/dL — AB (ref 7–26)
CHLORIDE: 111 mmol/L — AB (ref 98–109)
CO2: 22 mmol/L (ref 22–29)
CREATININE: 1.11 mg/dL — AB (ref 0.60–1.10)
Calcium: 8.7 mg/dL (ref 8.4–10.4)
GFR, Est AFR Am: 55 mL/min — ABNORMAL LOW (ref >60)
GFR, Estimated: 48 mL/min — ABNORMAL LOW (ref >60)
Glucose, Bld: 114 mg/dL (ref 70–140)
Potassium: 4 mmol/L (ref 3.5–5.1)
SODIUM: 138 mmol/L (ref 136–145)
Total Bilirubin: 0.5 mg/dL (ref 0.2–1.2)
Total Protein: 7.2 g/dL (ref 6.4–8.3)

## 2017-08-21 LAB — CBC WITH DIFFERENTIAL (CANCER CENTER ONLY)
BASOS ABS: 0.1 10*3/uL (ref 0.0–0.1)
Basophils Relative: 1 %
EOS PCT: 4 %
Eosinophils Absolute: 0.2 10*3/uL (ref 0.0–0.5)
HCT: 34.7 % — ABNORMAL LOW (ref 34.8–46.6)
Hemoglobin: 11.1 g/dL — ABNORMAL LOW (ref 11.6–15.9)
LYMPHS PCT: 18 %
Lymphs Abs: 1 10*3/uL (ref 0.9–3.3)
MCH: 30.4 pg (ref 25.1–34.0)
MCHC: 31.9 g/dL (ref 31.5–36.0)
MCV: 95.4 fL (ref 79.5–101.0)
MONO ABS: 0.4 10*3/uL (ref 0.1–0.9)
Monocytes Relative: 6 %
NEUTROS ABS: 4.1 10*3/uL (ref 1.5–6.5)
Neutrophils Relative %: 71 %
PLATELETS: 369 10*3/uL (ref 145–400)
RBC: 3.63 MIL/uL — AB (ref 3.70–5.45)
RDW: 25.1 % — ABNORMAL HIGH (ref 11.2–14.5)
WBC Count: 5.8 10*3/uL (ref 3.9–10.3)

## 2017-08-21 MED ORDER — SODIUM CHLORIDE 0.9 % IJ SOLN: INTRAMUSCULAR | 2 refills | Status: AC

## 2017-08-21 MED ORDER — ALLOPURINOL 100 MG PO TABS: 100.0000 mg | ORAL_TABLET | Freq: Every day | ORAL | 2 refills | Status: AC

## 2017-08-21 MED FILL — ALLOPURINOL 100 MG TABLET: 100 | 60 days supply | Qty: 60 | Fill #0

## 2017-08-21 NOTE — Telephone Encounter (Signed)
Appointments scheduled avs/calendar printed per 5/17 los °

## 2017-08-21 NOTE — Progress Notes (Signed)
[facility] Telephone:([phone]   Fax:(336) [place], [place] [phone]  DIAGNOSIS: Myeloproliferative disorder presented with polycythemia vera as well as essential thrombocythemia with JAK 2 positive mutation diagnosed in 2001  PRIOR THERAPY: Hydroxyurea 2000 mg p.o. daily  CURRENT THERAPY: 1) Jakafi (Ruxolitinib) 5 mg p.o. twice daily started July 29, 2017. 2) Anagrelide 1 mg p.o. 2 times daily.  This treatment will be on hold starting Aug 21, 2017.  INTERVAL HISTORY: Misty Herrera 72 y.o. female returns to the clinic today for follow-up visit accompanied by her son.  The patient is feeling much better since starting her treatment with Jakafi (Ruxolitinib).  Her skin lesions and rash has significantly improved.  She also gained 5 pounds since her last visit.  She denied having any chest pain, shortness of breath, cough or hemoptysis.  She denied having any fever or chills.  She has no nausea, vomiting, diarrhea or constipation.  She has no bleeding, bruises or ecchymosis.  She is here today for evaluation and repeat blood work.  MEDICAL HISTORY: Past Medical History:  Diagnosis Date  . DVT (deep venous thrombosis) ([facility])   . Hypertension   . Polycythemia     ALLERGIES:  has No Known Allergies.  MEDICATIONS:  Current Outpatient Medications  Medication Sig Dispense Refill  . amLODIPine-Valsartan-HCTZ (EXFORGE HCT) 10-160-12.5 MG TABS Take 1 tablet by mouth every evening.     Marland Kitchen anagrelide (AGRYLIN) 1 MG capsule Take 1 capsule (1 mg total) by mouth 2 (two) times daily. 60 capsule 1  . aspirin 81 MG chewable tablet Chew 81 mg by mouth daily.    . folic acid (FOLVITE) 814 MCG tablet Take 400 mcg by mouth daily.    Marland Kitchen levothyroxine (SYNTHROID, LEVOTHROID) 50 MCG tablet Take 50 mcg by mouth daily before breakfast.    . mupirocin ointment (BACTROBAN) 2 % Apply 1 application topically 2 (two)  times daily. 30 g 0  . Oxycodone HCl 10 MG TABS Take 1 tablet (10 mg total) by mouth 4 (four) times daily as needed. 60 tablet 0  . prochlorperazine (COMPAZINE) 10 MG tablet Take 1 tablet (10 mg total) by mouth every 6 (six) hours as needed for nausea or vomiting. 30 tablet 0  . Rivaroxaban 15 & 20 MG TBPK Take as directed on package: Start with one 15mg  tablet by mouth twice a day with food. On Day 22, switch to one 20mg  tablet once a day with food. 51 each 0  . ruxolitinib phosphate (JAKAFI) 5 MG tablet Take 1 tablet (5 mg total) by mouth 2 (two) times daily. 60 tablet 3  . UNABLE TO FIND Take 5 mg by mouth daily. Bisoprolol Hemifumarate-beta blocker     No current facility-administered medications for this visit.     SURGICAL HISTORY: No past surgical history on file.  REVIEW OF SYSTEMS:  A comprehensive review of systems was negative except for: Integument/breast: positive for rash and skin lesion(s)   PHYSICAL EXAMINATION: General appearance: alert, cooperative and no distress Head: Normocephalic, without obvious abnormality, atraumatic Neck: no adenopathy, no JVD, supple, symmetrical, trachea midline and thyroid not enlarged, symmetric, no tenderness/mass/nodules Lymph nodes: Cervical, supraclavicular, and axillary nodes normal. Resp: clear to auscultation bilaterally Back: symmetric, no curvature. ROM normal. No CVA tenderness. Cardio: regular rate and rhythm, S1, S2 normal, no murmur, click, rub or gallop GI: soft, non-tender; bowel sounds normal; no masses,  no  organomegaly Extremities: extremities normal, atraumatic, no cyanosis or edema and few skin ulcers and rash.  ECOG PERFORMANCE STATUS: 1 - Symptomatic but completely ambulatory  Blood pressure 106/76, pulse 65, temperature 97.9 F (36.6 C), temperature source Oral, resp. rate 18, height 5' 1.5" (1.562 m), weight 115 lb 11.2 oz (52.5 kg), SpO2 100 %.  LABORATORY DATA: Lab Results  Component Value Date   WBC 5.8  08/21/2017   HGB 11.1 (L) 08/21/2017   HCT 34.7 (L) 08/21/2017   MCV 95.4 08/21/2017   PLT 369 08/21/2017      Chemistry      Component Value Date/Time   NA 139 08/04/2017 1237   K 4.6 08/04/2017 1237   CL 108 08/04/2017 1237   CO2 26 08/04/2017 1237   BUN 32 (H) 08/04/2017 1237   CREATININE 1.12 (H) 08/04/2017 1237      Component Value Date/Time   CALCIUM 8.9 08/04/2017 1237   ALKPHOS 72 08/04/2017 1237   AST 20 08/04/2017 1237   ALT <6 08/04/2017 1237   BILITOT 0.5 08/04/2017 1237       RADIOGRAPHIC STUDIES: No results found.  ASSESSMENT AND PLAN: This is a very pleasant 72 years old white female with myeloproliferative disorders including polycythemia vera, essential thrombocythemia and splenomegaly.  She has positive Jak 2 mutation.  This was initially diagnosed in 2001 status post several years of treatment with hydroxyurea resulting in a lot of nonhealing skin ulcers. Her treatment with hydroxyurea was discontinued. The patient was started on anagrelide up to 1 mg p.o. 3 times daily but she continues to have worsening of her platelets count.  She was recently started on treatment with Jakafi (Ruxolitinib) for 20 days. She had significant improvement in her total white blood count as well as platelets count on the recent blood work performed earlier today. I recommended for the patient to continue on Jakafi (Ruxolitinib) 5 mg p.o. twice daily. I also advised her to discontinue her current treatment with anagrelide. I will monitor her blood count closely with repeat CBC, comprehensive metabolic panel, LDH and uric acid in 2 weeks. For the history of gout, I started the patient on allopurinol 100 mg p.o. twice daily. I also gave the patient refill of normal saline to be applied to the open skin lesions and also to keep her skin moist all the time with lotions and creams. She was advised to call immediately if she has any concerning symptoms in the interval. The patient  voices understanding of current disease status and treatment options and is in agreement with the current care plan.  All questions were answered. The patient knows to call the clinic with any problems, questions or concerns. We can certainly see the patient much sooner if necessary.  I spent 10 minutes counseling the patient face to face. The total time spent in the appointment was 15 minutes.  Disclaimer: This note was dictated with voice recognition software. Similar sounding words can inadvertently be transcribed and may not be corrected upon review.

## 2017-09-14 ENCOUNTER — Encounter: Payer: Self-pay | Admitting: Oncology

## 2017-09-14 ENCOUNTER — Inpatient Hospital Stay (HOSPITAL_BASED_OUTPATIENT_CLINIC_OR_DEPARTMENT_OTHER): Payer: Self-pay | Admitting: Oncology

## 2017-09-14 ENCOUNTER — Other Ambulatory Visit: Payer: Self-pay

## 2017-09-14 ENCOUNTER — Inpatient Hospital Stay: Payer: Self-pay

## 2017-09-14 ENCOUNTER — Inpatient Hospital Stay: Payer: Self-pay | Attending: Internal Medicine

## 2017-09-14 VITALS — BP 99/66 | HR 75 | Temp 97.7°F | Resp 18 | Ht 61.5 in | Wt 113.6 lb

## 2017-09-14 DIAGNOSIS — D45 Polycythemia vera: Secondary | ICD-10-CM

## 2017-09-14 DIAGNOSIS — D649 Anemia, unspecified: Secondary | ICD-10-CM

## 2017-09-14 DIAGNOSIS — R52 Pain, unspecified: Secondary | ICD-10-CM | POA: Insufficient documentation

## 2017-09-14 DIAGNOSIS — D473 Essential (hemorrhagic) thrombocythemia: Secondary | ICD-10-CM

## 2017-09-14 DIAGNOSIS — I10 Essential (primary) hypertension: Secondary | ICD-10-CM | POA: Insufficient documentation

## 2017-09-14 LAB — CBC WITH DIFFERENTIAL (CANCER CENTER ONLY)
BASOS ABS: 0.1 10*3/uL (ref 0.0–0.1)
Basophils Relative: 1 %
Eosinophils Absolute: 0.1 10*3/uL (ref 0.0–0.5)
Eosinophils Relative: 1 %
HEMATOCRIT: 28.8 % — AB (ref 34.8–46.6)
HEMOGLOBIN: 9.2 g/dL — AB (ref 11.6–15.9)
LYMPHS ABS: 0.6 10*3/uL — AB (ref 0.9–3.3)
LYMPHS PCT: 9 %
MCH: 29.6 pg (ref 25.1–34.0)
MCHC: 32 g/dL (ref 31.5–36.0)
MCV: 92.4 fL (ref 79.5–101.0)
Monocytes Absolute: 0.4 10*3/uL (ref 0.1–0.9)
Monocytes Relative: 6 %
NEUTROS PCT: 83 %
Neutro Abs: 5.7 10*3/uL (ref 1.5–6.5)
Platelet Count: 344 10*3/uL (ref 145–400)
RBC: 3.12 MIL/uL — ABNORMAL LOW (ref 3.70–5.45)
RDW: 26.7 % — ABNORMAL HIGH (ref 11.2–14.5)
WBC Count: 6.9 10*3/uL (ref 3.9–10.3)

## 2017-09-14 LAB — CMP (CANCER CENTER ONLY)
ALT: 7 U/L (ref 0–55)
ANION GAP: 8 (ref 3–11)
AST: 19 U/L (ref 5–34)
Albumin: 4.3 g/dL (ref 3.5–5.0)
Alkaline Phosphatase: 84 U/L (ref 40–150)
BUN: 35 mg/dL — ABNORMAL HIGH (ref 7–26)
CHLORIDE: 107 mmol/L (ref 98–109)
CO2: 24 mmol/L (ref 22–29)
CREATININE: 1.16 mg/dL — AB (ref 0.60–1.10)
Calcium: 8.5 mg/dL (ref 8.4–10.4)
GFR, EST AFRICAN AMERICAN: 52 mL/min — AB (ref >60)
GFR, Estimated: 45 mL/min — ABNORMAL LOW (ref >60)
Glucose, Bld: 90 mg/dL (ref 70–140)
Potassium: 4.2 mmol/L (ref 3.5–5.1)
SODIUM: 139 mmol/L (ref 136–145)
Total Bilirubin: 0.7 mg/dL (ref 0.2–1.2)
Total Protein: 7.6 g/dL (ref 6.4–8.3)

## 2017-09-14 LAB — LACTATE DEHYDROGENASE: LDH: 586 U/L — AB (ref 125–245)

## 2017-09-14 LAB — URIC ACID: Uric Acid, Serum: 6.9 mg/dL (ref 2.6–7.4)

## 2017-09-14 MED ORDER — OXYCODONE HCL 10 MG PO TABS: 10.0000 mg | ORAL_TABLET | Freq: Four times a day (QID) | ORAL | 0 refills | Status: AC | PRN

## 2017-09-14 MED FILL — oxyCODONE HCL 10 MG TABS: 10 | 15 days supply | Qty: 60 | Fill #0

## 2017-09-14 MED FILL — SODIUM CHLORIDE 0.9% IRRIG.: 0.9 | 30 days supply | Qty: 1000 | Fill #0

## 2017-09-14 NOTE — Progress Notes (Signed)
[facility] OFFICE PROGRESS NOTE  Curt Bears, MD [address] [place] [address]  DIAGNOSIS: Myeloproliferative disorder presented with polycythemia vera as well as essential thrombocythemia with JAK 2 positive mutation diagnosed in 2001  PRIOR THERAPY: 1) Hydroxyurea 2000 mg p.o. daily 2) Anagrelide 1 mg p.o. 2 times daily.  This treatment will be on hold starting Aug 21, 2017.  Last dose given on 08/21/2017.  CURRENT THERAPY: Jakafi (Ruxolitinib) 5 mg p.o. twice daily started July 29, 2017.  INTERVAL HISTORY: Misty Herrera 72 y.o. female returns for routine follow-up visit coming by her son.  The patient reports that she is having fatigue and is not feeling as well as she did at her last visit.  Her son reports that her appetite has been decreased and she has lost 2 pounds since her last visit.  She has ongoing itching.  She is using an antihistamine for this.  She continues to have pain to her hands.  She has run out of her oxycodone.  She requests a refill this today.  She denies fevers and chills.  Denies chest pain, shortness of breath, cough, hemoptysis.  Reports mild nausea but no vomiting.  Denies constipation and diarrhea.  Denies bleeding, bruising, ecchymosis.  The patient is here for evaluation and repeat lab work.  MEDICAL HISTORY: Past Medical History:  Diagnosis Date  . DVT (deep venous thrombosis) ([place])   . Hypertension   . Polycythemia     ALLERGIES:  has No Known Allergies.  MEDICATIONS:  Current Outpatient Medications  Medication Sig Dispense Refill  . allopurinol (ZYLOPRIM) 100 MG tablet Take 1 tablet (100 mg total) by mouth daily. 60 tablet 2  . amLODIPine-Valsartan-HCTZ (EXFORGE HCT) 10-160-12.5 MG TABS Take 1 tablet by mouth every evening.     Marland Kitchen aspirin 81 MG chewable tablet Chew 81 mg by mouth daily.    . folic acid (FOLVITE) 269 MCG tablet Take 400 mcg by mouth daily.    Marland Kitchen levothyroxine (SYNTHROID, LEVOTHROID) 50 MCG  tablet Take 50 mcg by mouth daily before breakfast.    . mupirocin ointment (BACTROBAN) 2 % Apply 1 application topically 2 (two) times daily. 30 g 0  . Oxycodone HCl 10 MG TABS Take 1 tablet (10 mg total) by mouth 4 (four) times daily as needed. 60 tablet 0  . prochlorperazine (COMPAZINE) 10 MG tablet Take 1 tablet (10 mg total) by mouth every 6 (six) hours as needed for nausea or vomiting. 30 tablet 0  . Rivaroxaban 15 & 20 MG TBPK Take as directed on package: Start with one 66m tablet by mouth twice a day with food. On Day 22, switch to one 236mtablet once a day with food. 51 each 0  . ruxolitinib phosphate (JAKAFI) 5 MG tablet Take 1 tablet (5 mg total) by mouth 2 (two) times daily. 60 tablet 3  . sodium chloride 0.9 % injection Apply to skin lesions 4-6 times daily 250 mL 2  . UNABLE TO FIND Take 5 mg by mouth daily. Bisoprolol Hemifumarate-beta blocker     No current facility-administered medications for this visit.     SURGICAL HISTORY: History reviewed. No pertinent surgical history.  REVIEW OF SYSTEMS:   Review of Systems  Constitutional: Negative for chills, fever.  Positive for fatigue, decreased appetite, and weight loss.  HENT:   Negative for mouth sores, nosebleeds, sore throat and trouble swallowing.   Eyes: Negative for eye problems and icterus.  Respiratory: Negative for cough, hemoptysis, shortness  of breath and wheezing.   Cardiovascular: Negative for chest pain and leg swelling.  Gastrointestinal: Negative for abdominal pain, constipation, diarrhea, and vomiting. Positive for intermittent nausea. Genitourinary: Negative for bladder incontinence, difficulty urinating, dysuria, frequency and hematuria.   Musculoskeletal: Negative for gait problem, neck pain and neck stiffness. Reports upper back pain. Skin: Negative for rash. Positive for itching.  Positive for dry and cracked skin to her bilateral hands and feet. Neurological: Negative for dizziness, extremity weakness,  gait problem, headaches, light-headedness and seizures.  Hematological: Negative for adenopathy. Does not bruise/bleed easily.  Psychiatric/Behavioral: Negative for confusion, depression and sleep disturbance. The patient is not nervous/anxious.     PHYSICAL EXAMINATION:  Blood pressure 99/66, pulse 75, temperature 97.7 F (36.5 C), temperature source Oral, resp. rate 18, height 5' 1.5" (1.562 m), weight 113 lb 9.6 oz (51.5 kg), SpO2 98 %.  ECOG PERFORMANCE STATUS: 1 - Symptomatic but completely ambulatory  Physical Exam  Constitutional: Oriented to person, place, and time. No distress.  HENT:  Head: Normocephalic and atraumatic.  Mouth/Throat: Oropharynx is clear and moist. No oropharyngeal exudate.  Eyes: Conjunctivae are normal. Right eye exhibits no discharge. Left eye exhibits no discharge. No scleral icterus.  Neck: Normal range of motion. Neck supple.  Cardiovascular: Normal rate, regular rhythm, normal heart sounds and intact distal pulses.   Pulmonary/Chest: Effort normal and breath sounds normal. No respiratory distress. No wheezes. No rales.  Abdominal: Soft. Bowel sounds are normal. Exhibits no distension and no mass. There is no tenderness.  Musculoskeletal: Normal range of motion. Exhibits no edema.  Lymphadenopathy:    No cervical adenopathy.  Neurological: Alert and oriented to person, place, and time. Exhibits normal muscle tone. Gait normal. Coordination normal.  Skin: Skin is dry and scaly to her hands and feet.Marland Kitchen  Psychiatric: Mood, memory and judgment normal.  Vitals reviewed.  LABORATORY DATA: Lab Results  Component Value Date   WBC 6.9 09/14/2017   HGB 9.2 (L) 09/14/2017   HCT 28.8 (L) 09/14/2017   MCV 92.4 09/14/2017   PLT 344 09/14/2017      Chemistry      Component Value Date/Time   NA 139 09/14/2017 1340   K 4.2 09/14/2017 1340   CL 107 09/14/2017 1340   CO2 24 09/14/2017 1340   BUN 35 (H) 09/14/2017 1340   CREATININE 1.16 (H) 09/14/2017 1340       Component Value Date/Time   CALCIUM 8.5 09/14/2017 1340   ALKPHOS 84 09/14/2017 1340   AST 19 09/14/2017 1340   ALT 7 09/14/2017 1340   BILITOT 0.7 09/14/2017 1340       RADIOGRAPHIC STUDIES:  No results found.   ASSESSMENT/PLAN:  This is a very pleasant 72 year old white female with myeloproliferative disorders including polycythemia vera, essential thrombocythemia and splenomegaly.  She has positive Jak 2 mutation.  This was initially diagnosed in 2001 status post several years of treatment with hydroxyurea resulting in a lot of nonhealing skin ulcers. Her treatment with hydroxyurea was discontinued. The patient was started on anagrelide up to 1 mg p.o. 3 times daily but she continued to have worsening of her platelet count.  She was then started on treatment with Jakafi (Ruxolitinib).  Status post 6 weeks of treatment. On Jakafi, she had significant improvement in her total white blood count as well as platelet count.  Anagrelide was discontinued.    The patient was seen with Dr. Julien Nordmann.  Lab results were discussed with the patient and her son.  We discussed that she is more anemic which may be causing some of her fatigue.  We will consider a blood transfusion if her hemoglobin is 8.0 or less. Her platelet count remains normal.  We discussed with the patient that her peripheral smear showed 3% blasts today.  Will need to watch this closely due to the risk of developing leukemia.  The patient will be sent back to the lab today for BCR ABL.  She will be traveling back to [place] next week.  We discussed the possibility she may need a repeat bone marrow biopsy when she returns home. We will bring her back just before she leaves next week for evaluation and repeat lab work.  For pain, I have refilled her oxycodone today.  She was advised to keep her skin moist all the time with lotions and creams.  She was advised to call immediately if she has any concerning symptoms in the  interval. The patient voices understanding of current disease status and treatment options and is in agreement with the current care plan.  All questions were answered. The patient knows to call the clinic with any problems, questions or concerns. We can certainly see the patient much sooner if necessary.  Orders Placed This Encounter  Procedures  . BCR ABL1 FISH ([facility])    Standing Status:   Future    Number of Occurrences:   1    Standing Expiration Date:   09/15/2018  . CBC with Differential ([facility] Only)    Standing Status:   Future    Standing Expiration Date:   09/15/2018  . CMP ([facility] only)    Standing Status:   Future    Standing Expiration Date:   09/15/2018  . Lactate dehydrogenase    Standing Status:   Future    Standing Expiration Date:   09/15/2018  . Uric acid    Standing Status:   Future    Standing Expiration Date:   09/14/2018   Mikey Bussing, DNP, AGPCNP-BC, AOCNP 09/14/17  ADDENDUM: Hematology/Oncology Attending:  I had a face-to-face encounter with the patient today.  I recommended her care plan.  This is a very pleasant 72 years  old white female with myeloproliferative disorder with positive Jak 2 mutation presented with polycythemia vera as well as essential thrombocythemia status post several years of treatment with hydroxyurea with worsening skin ulcer and intolerability to the treatment.  She was a started several weeks ago on Jakafi (Ruxolitinib).  The patient has been tolerating this treatment fairly well and she had significant improvement in her skin ulcers as well as her platelets count. Repeat CBC today showed persistent anemia but normal total white blood count and platelets count.  The peripheral blood smear also showed 5% blasts which could be concerning for transformation into chronic myeloid leukemia. I will order [place] study for BCR/ABL to rule out the development of chronic myeloid leukemia. The patient is traveling back to [place] in  around 10 days.  I would repeat her CBC in 1 week for further evaluation before her travel.  She was advised to continue her current treatment with Jakafi (Ruxolitinib) For pain management she was given refill of her pain medication. The patient was also advised to call immediately if she has any concerning symptoms in the interval.  Disclaimer: This note was dictated with voice recognition software. Similar sounding words can inadvertently be transcribed and may be missed upon review. Eilleen Kempf, MD 09/14/17

## 2017-09-15 ENCOUNTER — Telehealth: Payer: Self-pay | Admitting: Internal Medicine

## 2017-09-15 NOTE — Telephone Encounter (Signed)
Appointment changed letter/calendar mailed to patient per 6/10 staff message

## 2017-09-18 ENCOUNTER — Ambulatory Visit: Payer: Self-pay | Admitting: Oncology

## 2017-09-18 ENCOUNTER — Other Ambulatory Visit: Payer: Self-pay

## 2017-09-22 LAB — BCR ABL1 FISH (GENPATH)

## 2017-09-22 NOTE — Progress Notes (Signed)
[facility] OFF[facility] PROGRESS NOTE  Misty Bears, MD [address] [place] [address]  DIAGNOSIS: Myeloproliferative disorder presented with polycythemia vera as well as essential thrombocythemia with JAK 2 positive mutation diagnosed in 2001  PRIOR THERAPY: 1) Hydroxyurea 2000 mg p.o. daily 2) Anagrelide 1 mg p.o. 2 times daily.This treatment will be on hold starting Aug 21, 2017.  Last dose given on 08/21/2017.  CURRENT THERAPY: Jakafi (Ruxolitinib) 5 mg p.o. twice daily started July 29, 2017.  INTERVAL HISTORY: Misty Herrera 72 y.o. female returns for routine follow-up visit accompanied by her son.  The patient is feeling fine today and has no specific complaints for ongoing fatigue which is unchanged from previous reports.  The patient's son states that her pain is well controlled with oxycodone.  The itching has been stable.  She uses an antihistamine for this.  She denies fevers and chills.  Denies chest pain, shortness of breath, cough, hemoptysis.  Denies nausea, vomiting, constipation, diarrhea.  Denies bleeding, bruising, ecchymosis.  The patient is here for evaluation and repeat lab work.  MEDICAL HISTORY: Past Medical History:  Diagnosis Date  . DVT (deep venous thrombosis) ([place])   . Hypertension   . Polycythemia     ALLERGIES:  has No Known Allergies.  MEDICATIONS:  Current Outpatient Medications  Medication Sig Dispense Refill  . allopurinol (ZYLOPRIM) 100 MG tablet Take 1 tablet (100 mg total) by mouth daily. 60 tablet 2  . amLODIPine-Valsartan-HCTZ (EXFORGE HCT) 10-160-12.5 MG TABS Take 1 tablet by mouth every evening.     Marland Kitchen aspirin 81 MG chewable tablet Chew 81 mg by mouth daily.    . folic acid (FOLVITE) 419 MCG tablet Take 400 mcg by mouth daily.    Marland Kitchen levothyroxine (SYNTHROID, LEVOTHROID) 50 MCG tablet Take 50 mcg by mouth daily before breakfast.    . mupirocin ointment (BACTROBAN) 2 % Apply 1 application topically 2 (two) times  daily. 30 g 0  . Oxycodone HCl 10 MG TABS Take 1 tablet (10 mg total) by mouth 4 (four) times daily as needed. 60 tablet 0  . prochlorperazine (COMPAZINE) 10 MG tablet Take 1 tablet (10 mg total) by mouth every 6 (six) hours as needed for nausea or vomiting. 30 tablet 0  . Rivaroxaban 15 & 20 MG TBPK Take as directed on package: Start with one 15mg  tablet by mouth twice a day with food. On Day 22, switch to one 20mg  tablet once a day with food. 51 each 0  . ruxolitinib phosphate (JAKAFI) 5 MG tablet Take 1 tablet (5 mg total) by mouth 2 (two) times daily. 60 tablet 3  . sodium chloride 0.9 % injection Apply to skin lesions 4-6 times daily 250 mL 2  . UNABLE TO FIND Take 5 mg by mouth daily. Bisoprolol Hemifumarate-beta blocker     No current facility-administered medications for this visit.     SURGICAL HISTORY: History reviewed. No pertinent surgical history.  REVIEW OF SYSTEMS:   Review of Systems  Constitutional: Negative for appetite change, chills, fever and unexpected weight change. Positive for fatigue. HENT:   Negative for mouth sores, nosebleeds, sore throat and trouble swallowing.   Eyes: Negative for eye problems and icterus.  Respiratory: Negative for cough, hemoptysis, shortness of breath and wheezing.   Cardiovascular: Negative for chest pain and leg swelling.  Gastrointestinal: Negative for abdominal pain, constipation, diarrhea, nausea and vomiting.  Genitourinary: Negative for bladder incontinence, difficulty urinating, dysuria, frequency and hematuria.   Musculoskeletal: Negative  for back pain, gait problem, neck pain and neck stiffness.  Skin: Negative for itching and rash.  Neurological: Negative for dizziness, extremity weakness, gait problem, headaches, light-headedness and seizures.  Hematological: Negative for adenopathy. Does not bruise/bleed easily.  Psychiatric/Behavioral: Negative for confusion, depression and sleep disturbance. The patient is not  nervous/anxious.     PHYSICAL EXAMINATION:  Blood pressure 105/66, pulse 60, temperature 98.3 F (36.8 C), temperature source Oral, resp. rate 18, height 5' 1.5" (1.562 m), weight 120 lb (54.4 kg), SpO2 100 %.  ECOG PERFORMANCE STATUS: 1 - Symptomatic but completely ambulatory  Physical Exam  Constitutional: Oriented to person, place, and time and well-developed, well-nourished, and in no distress. No distress.  HENT:  Head: Normocephalic and atraumatic.  Mouth/Throat: Oropharynx is clear and moist. No oropharyngeal exudate.  Eyes: Conjunctivae are normal. Right eye exhibits no discharge. Left eye exhibits no discharge. No scleral icterus.  Neck: Normal range of motion. Neck supple.  Cardiovascular: Normal rate, regular rhythm, normal heart sounds and intact distal pulses.   Pulmonary/Chest: Effort normal and breath sounds normal. No respiratory distress. No wheezes. No rales.  Abdominal: Soft. Bowel sounds are normal. Exhibits no distension and no mass. There is no tenderness.  Musculoskeletal: Normal range of motion. Exhibits no edema.  Lymphadenopathy:    No cervical adenopathy.  Neurological: Alert and oriented to person, place, and time. Exhibits normal muscle tone. Gait normal. Coordination normal.  Skin: Dry, cracked skin on her hands and ankles.  Skin is no longer breaking open.  Psychiatric: Mood, memory and judgment normal.  Vitals reviewed.  LABORATORY DATA: Lab Results  Component Value Date   WBC 8.7 09/23/2017   HGB 7.6 (L) 09/23/2017   HCT 23.5 (L) 09/23/2017   MCV 92.6 09/23/2017   PLT 372 09/23/2017      Chemistry      Component Value Date/Time   NA 138 09/23/2017 1232   K 4.0 09/23/2017 1232   CL 107 09/23/2017 1232   CO2 24 09/23/2017 1232   BUN 28 (H) 09/23/2017 1232   CREATININE 1.09 09/23/2017 1232      Component Value Date/Time   CALCIUM 8.1 (L) 09/23/2017 1232   ALKPHOS 72 09/23/2017 1232   AST 18 09/23/2017 1232   ALT <6 09/23/2017 1232    BILITOT 0.6 09/23/2017 1232       RADIOGRAPHIC STUDIES:  No results found.   ASSESSMENT/PLAN:  This is a very pleasant 72 year old white female with myeloproliferative disorders including polycythemia vera, essential thrombocythemia and splenomegaly. She has positive Jak 2 mutation. This was initially diagnosed in 2001 status post several years of treatment with hydroxyurea resulting in a lot of nonhealing skin ulcers. Her treatment with hydroxyurea was discontinued. The patient was started on anagrelide up to 1 mg p.o.3times daily but she continued to have worsening of her platelet count. She was then started on treatment with Jakafi (Ruxolitinib).  Status post 6 weeks of treatment. On Jakafi, she had significant improvement in her total white blood count as well as platelet count.  Anagrelide was discontinued.    The patient was seen with Dr. Julien Nordmann.  Lab results were discussed with the patient and her son.  We discussed that she is more anemic which may be causing some of her fatigue.  Recommend that she proceed with 1 unit of packed red blood cells today for her anemia. Her platelet count remains normal.    Peripheral smear did not show any blasts day. BCR/abl was negative.  Recommend for her to continue Jakafi he 5 mg twice daily.  The patient will be traveling to [place] tomorrow.  She will be there for approximately 1 month.  We encouraged her to follow-up with her medical providers there.  She was given a copy of her lab work today.  Her son was given information about how to access My Chart.  He was also given information on how to obtain medical records.  For pain, she will continue oxycodone.  She was advised to keep her skin moist all the time with lotions and creams.  The patient and her son were instructed to contact us when she returns from [place].  We will schedule a follow-up visit for her at that time.  She was advised to call immediately if she has any concerning  symptoms in the interval. The patient voices understanding of current disease status and treatment options and is in agreement with the current care plan.  All questions were answered. The patient knows to call the clinic with any problems, questions or concerns. We can certainly see the patient much sooner if necessary.  Orders Placed This Encounter  Procedures  . Practitioner attestation of consent    I, the ordering practitioner, attest that I have discussed with the patient the benefits, risks, side effects, alternatives, likelihood of achieving goals and potential problems during recovery for the procedure listed.    Standing Status:   Future    Standing Expiration Date:   09/23/2018    Order Specific Question:   Procedure    Answer:   Blood Product(s)  . Complete patient signature process for consent form    Standing Status:   Future    Standing Expiration Date:   09/23/2018  . Care order/instruction    Transfuse Parameters    Standing Status:   Future    Standing Expiration Date:   09/23/2018  . Type and screen    Standing Status:   Future    Number of Occurrences:   1    Standing Expiration Date:   09/24/2018  . ABO/Rh   Mikey Bussing, DNP, AGPCNP-BC, AOCNP 09/23/17  ADDENDUM: Hematology/Oncology Attending: I had a face-to-face encounter with the patient today.  I recommended her care plan.  This is a very pleasant 72 years old white female with myeloproliferative disorder with Jak 2 mutation who presented with essential thrombocythemia as well as polycythemia vera.  The patient has been on treatment with hydroxyurea for several years discontinued secondary to progression as well as development of drug-induced skin ulcers and rash.  She was recently started on treatment with Jakafi (Ruxolitinib) 5 mg p.o. twice daily and she had significant improvement in her disease with the new medication.  She continues to complain of fatigue and her hemoglobin today is down to 7.6. We will  arrange for the patient to receive 1 unit of PRBCs transfusion today before her travel to [place] tomorrow.  We may also adjust her dose of Jakafi (Ruxolitinib) to 1 tablet p.o. daily at least for the next few weeks.  She was given enough medication to cover her during her stay in [place]. She will have blood work and medical care in [place] by her previous hematologist.\ Molecular studies for BCR/ABL was negative.  CBC today showed no evidence of blasts. We will arrange for the patient to have a follow-up appointment after she comes back to [place]. The patient was advised to call if she has any concerning symptoms.  Her son will have  a copy of her medical record sent to her hematologist in [place].  Disclaimer: This note was dictated with voice recognition software. Similar sounding words can inadvertently be transcribed and may be missed upon review. Eilleen Kempf, MD 09/23/17

## 2017-09-23 ENCOUNTER — Inpatient Hospital Stay (HOSPITAL_BASED_OUTPATIENT_CLINIC_OR_DEPARTMENT_OTHER): Payer: Self-pay | Admitting: Oncology

## 2017-09-23 ENCOUNTER — Inpatient Hospital Stay: Payer: Self-pay

## 2017-09-23 ENCOUNTER — Encounter: Payer: Self-pay | Admitting: Oncology

## 2017-09-23 ENCOUNTER — Other Ambulatory Visit: Payer: Self-pay

## 2017-09-23 VITALS — BP 105/66 | HR 60 | Temp 98.3°F | Resp 18 | Ht 61.5 in | Wt 120.0 lb

## 2017-09-23 DIAGNOSIS — D649 Anemia, unspecified: Secondary | ICD-10-CM

## 2017-09-23 DIAGNOSIS — D45 Polycythemia vera: Secondary | ICD-10-CM

## 2017-09-23 DIAGNOSIS — Z86718 Personal history of other venous thrombosis and embolism: Secondary | ICD-10-CM

## 2017-09-23 DIAGNOSIS — I10 Essential (primary) hypertension: Secondary | ICD-10-CM

## 2017-09-23 DIAGNOSIS — D473 Essential (hemorrhagic) thrombocythemia: Secondary | ICD-10-CM

## 2017-09-23 DIAGNOSIS — R52 Pain, unspecified: Secondary | ICD-10-CM

## 2017-09-23 LAB — CBC WITH DIFFERENTIAL (CANCER CENTER ONLY)
BASOS ABS: 0.1 10*3/uL (ref 0.0–0.1)
Basophils Relative: 2 %
EOS ABS: 0.2 10*3/uL (ref 0.0–0.5)
Eosinophils Relative: 2 %
HEMATOCRIT: 23.5 % — AB (ref 34.8–46.6)
HEMOGLOBIN: 7.6 g/dL — AB (ref 11.6–15.9)
LYMPHS ABS: 1 10*3/uL (ref 0.9–3.3)
LYMPHS PCT: 11 %
MCH: 29.9 pg (ref 25.1–34.0)
MCHC: 32.2 g/dL (ref 31.5–36.0)
MCV: 92.6 fL (ref 79.5–101.0)
MONOS PCT: 5 %
Monocytes Absolute: 0.5 10*3/uL (ref 0.1–0.9)
NEUTROS ABS: 7 10*3/uL — AB (ref 1.5–6.5)
NEUTROS PCT: 80 %
Platelet Count: 372 10*3/uL (ref 145–400)
RBC: 2.53 MIL/uL — ABNORMAL LOW (ref 3.70–5.45)
RDW: 26.6 % — ABNORMAL HIGH (ref 11.2–14.5)
WBC Count: 8.7 10*3/uL (ref 3.9–10.3)

## 2017-09-23 LAB — CMP (CANCER CENTER ONLY)
ALK PHOS: 72 U/L (ref 40–150)
AST: 18 U/L (ref 5–34)
Albumin: 3.8 g/dL (ref 3.5–5.0)
Anion gap: 7 (ref 3–11)
BUN: 28 mg/dL — ABNORMAL HIGH (ref 7–26)
CALCIUM: 8.1 mg/dL — AB (ref 8.4–10.4)
CHLORIDE: 107 mmol/L (ref 98–109)
CO2: 24 mmol/L (ref 22–29)
CREATININE: 1.09 mg/dL (ref 0.60–1.10)
GFR, EST NON AFRICAN AMERICAN: 49 mL/min — AB (ref >60)
GFR, Est AFR Am: 57 mL/min — ABNORMAL LOW (ref >60)
Glucose, Bld: 124 mg/dL (ref 70–140)
Potassium: 4 mmol/L (ref 3.5–5.1)
Sodium: 138 mmol/L (ref 136–145)
Total Bilirubin: 0.6 mg/dL (ref 0.2–1.2)
Total Protein: 7 g/dL (ref 6.4–8.3)

## 2017-09-23 LAB — LACTATE DEHYDROGENASE: LDH: 637 U/L — AB (ref 125–245)

## 2017-09-23 LAB — ABO/RH: ABO/RH(D): B POS

## 2017-09-23 LAB — URIC ACID: URIC ACID, SERUM: 6.7 mg/dL (ref 2.6–7.4)

## 2017-09-23 LAB — PREPARE RBC (CROSSMATCH)

## 2017-09-23 MED ORDER — DIPHENHYDRAMINE HCL 25 MG PO CAPS
ORAL_CAPSULE | ORAL | Status: AC
Start: 2017-09-23 — End: ?
  Filled 2017-09-23: qty 1

## 2017-09-23 MED ORDER — SODIUM CHLORIDE 0.9 % IV SOLN
250.0000 mL | Freq: Once | INTRAVENOUS | Status: AC
  Administered 2017-09-23: 250 mL via INTRAVENOUS

## 2017-09-23 MED ORDER — ACETAMINOPHEN 325 MG PO TABS
ORAL_TABLET | ORAL | Status: AC
  Filled 2017-09-23: qty 2

## 2017-09-23 MED ORDER — ACETAMINOPHEN 325 MG PO TABS
650.0000 mg | ORAL_TABLET | Freq: Once | ORAL | Status: AC
  Administered 2017-09-23: 650 mg via ORAL

## 2017-09-23 MED ORDER — DIPHENHYDRAMINE HCL 25 MG PO CAPS
25.0000 mg | ORAL_CAPSULE | Freq: Once | ORAL | Status: AC
  Administered 2017-09-23: 25 mg via ORAL

## 2017-09-23 NOTE — Patient Instructions (Signed)
Blood Transfusion, Adult, Care After This sheet gives you information about how to care for yourself after your procedure. Your health care provider may also give you more specific instructions. If you have problems or questions, contact your health care provider. What can I expect after the procedure? After your procedure, it is common to have:  Bruising and soreness where the IV tube was inserted.  Headache.  Follow these instructions at home:  Take over-the-counter and prescription medicines only as told by your health care provider.  Return to your normal activities as told by your health care provider.  Follow instructions from your health care provider about how to take care of your IV insertion site. Make sure you: ? Wash your hands with soap and water before you change your bandage (dressing). If soap and water are not available, use hand sanitizer. ? Change your dressing as told by your health care provider.  Check your IV insertion site every day for signs of infection. Check for: ? More redness, swelling, or pain. ? More fluid or blood. ? Warmth. ? Pus or a bad smell. Contact a health care provider if:  You have more redness, swelling, or pain around the IV insertion site.  You have more fluid or blood coming from the IV insertion site.  Your IV insertion site feels warm to the touch.  You have pus or a bad smell coming from the IV insertion site.  Your urine turns pink, red, or brown.  You feel weak after doing your normal activities. Get help right away if:  You have signs of a serious allergic or immune system reaction, including: ? Itchiness. ? Hives. ? Trouble breathing. ? Anxiety. ? Chest or lower back pain. ? Fever, flushing, and chills. ? Rapid pulse. ? Rash. ? Diarrhea. ? Vomiting. ? Dark urine. ? Serious headache. ? Dizziness. ? Stiff neck. ? Yellow coloration of the face or the white parts of the eyes (jaundice). This information is not  intended to replace advice given to you by your health care provider. Make sure you discuss any questions you have with your health care provider. Document Released: 04/14/2014 Document Revised: 11/21/2015 Document Reviewed: 10/08/2015 Elsevier Interactive Patient Education  2018 [employer]  

## 2017-09-24 LAB — TYPE AND SCREEN
ABO/RH(D): B POS
Antibody Screen: NEGATIVE
UNIT DIVISION: 0

## 2017-09-24 LAB — BPAM RBC
BLOOD PRODUCT EXPIRATION DATE: 201907162359
ISSUE DATE / TIME: 201906191525
Unit Type and Rh: 7300

## 2017-11-17 ENCOUNTER — Other Ambulatory Visit: Payer: Self-pay | Admitting: Medical Oncology

## 2017-11-17 DIAGNOSIS — D473 Essential (hemorrhagic) thrombocythemia: Secondary | ICD-10-CM

## 2017-11-17 MED ORDER — RUXOLITINIB PHOSPHATE 5 MG PO TABS: 5.0000 mg | ORAL_TABLET | Freq: Two times a day (BID) | ORAL | 3 refills | Status: DC

## 2018-03-11 ENCOUNTER — Other Ambulatory Visit: Payer: Self-pay | Admitting: *Deleted

## 2018-03-11 DIAGNOSIS — D473 Essential (hemorrhagic) thrombocythemia: Secondary | ICD-10-CM

## 2018-03-11 MED ORDER — RUXOLITINIB PHOSPHATE 5 MG PO TABS: 5.0000 mg | ORAL_TABLET | Freq: Two times a day (BID) | ORAL | 3 refills | Status: DC

## 2018-03-11 NOTE — Telephone Encounter (Signed)
[facility] Rx faxed to [facility]

## 2018-03-11 NOTE — Telephone Encounter (Signed)
Inbasket rcvd from [facility] regarding new Rx for pt to be faxed to [facility].   Call to pt s/w Jaw (Joe) who advised pt is still in [place], she is still having problem with her hands ( skin peeling/cracked) and he does not know when she will return. Pt is being seen by physicians in [place] to treat hands. Discussed with Jaw, pt will need a f/u with MD upon returning to states, to please call office when she returns. Jaw verbalized understanding. No further concerns at this time.

## 2018-07-19 ENCOUNTER — Other Ambulatory Visit: Payer: Self-pay | Admitting: Medical Oncology

## 2018-07-19 DIAGNOSIS — D473 Essential (hemorrhagic) thrombocythemia: Secondary | ICD-10-CM

## 2018-07-19 MED ORDER — RUXOLITINIB PHOSPHATE 5 MG PO TABS: 5.0000 mg | ORAL_TABLET | Freq: Two times a day (BID) | ORAL | 3 refills | Status: AC

## 2018-07-20 ENCOUNTER — Telehealth: Payer: Self-pay

## 2018-07-20 NOTE — Telephone Encounter (Signed)
Oral Oncology Patient Advocate Encounter  [employer] assistance application is due for renewal on 07/27/18. I spoke to [place] at [facility] and during the COVID-19 pandemic then patient will just need to call in to renew her application.  I called her son, Wille Glaser and gave him this information along with the phone number to call [phone]. Joe informed me that his mom is currently stuck in [place] and will not be able to return until COVID-19 restrictions have been lifted.  When she returns Joe and his mom will call to renew the [facility] application and give me a call so I can complete it on my end.  This encounter will be updated until final determination.  [employer] Phone [phone] Fax [phone] 07/20/2018   10:35 AM

## 2019-05-15 IMAGING — US US ABDOMEN COMPLETE
1 series · 14 of 25 positions shown · non-contrast
Comparison: None.

CLINICAL DATA: Myeloproliferative disorder. Rule out splenomegaly.
Hypertension and polycythemia

EXAM:
ABDOMEN ULTRASOUND COMPLETE

[Series 1: us abdomen complete · 14 of 108 slices shown]
[im 1/108]
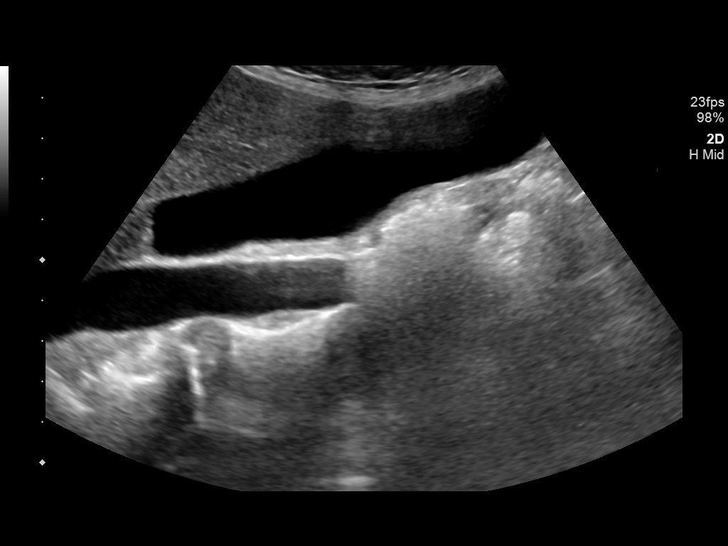
[im 9/108]
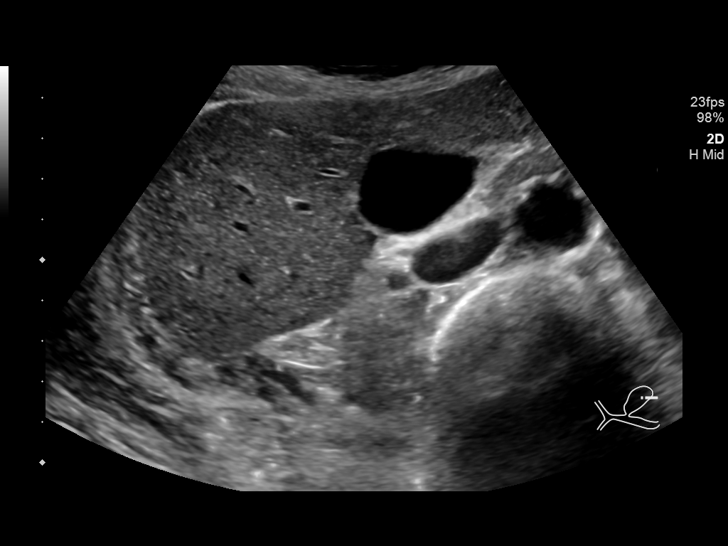
[im 18/108]
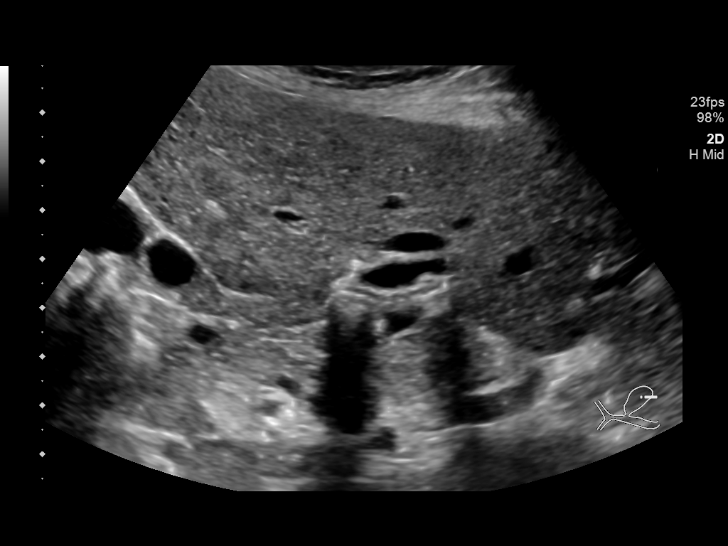
[im 27/108]
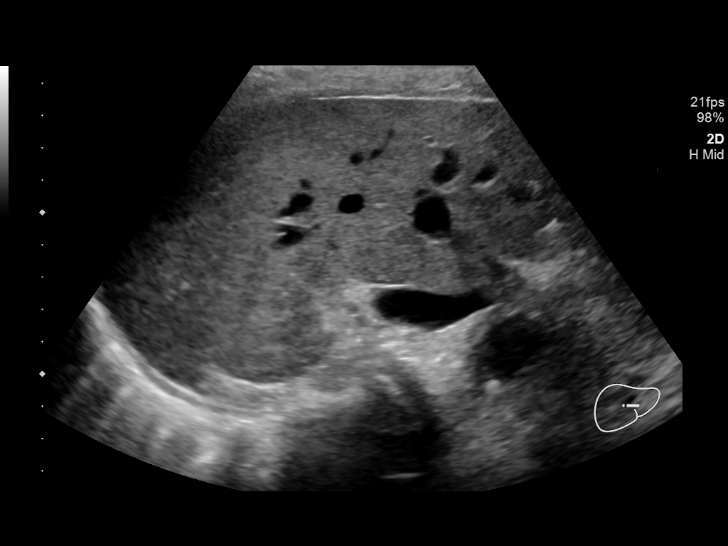
[im 36/108]
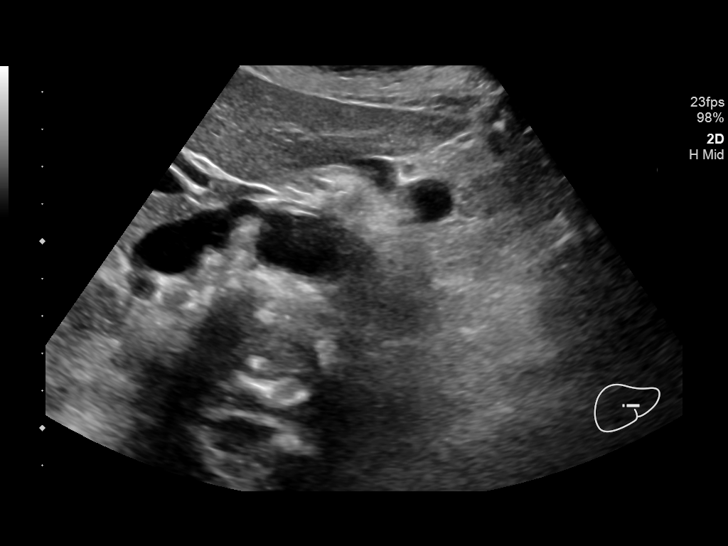
[im 41/108]
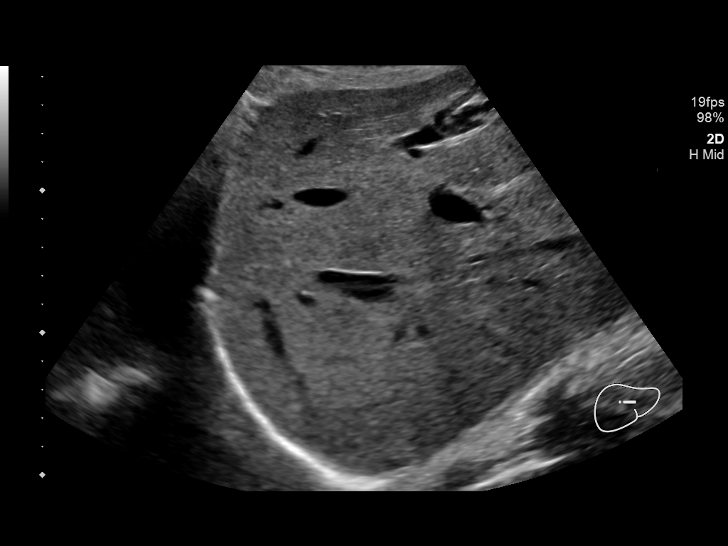
[im 50/108]
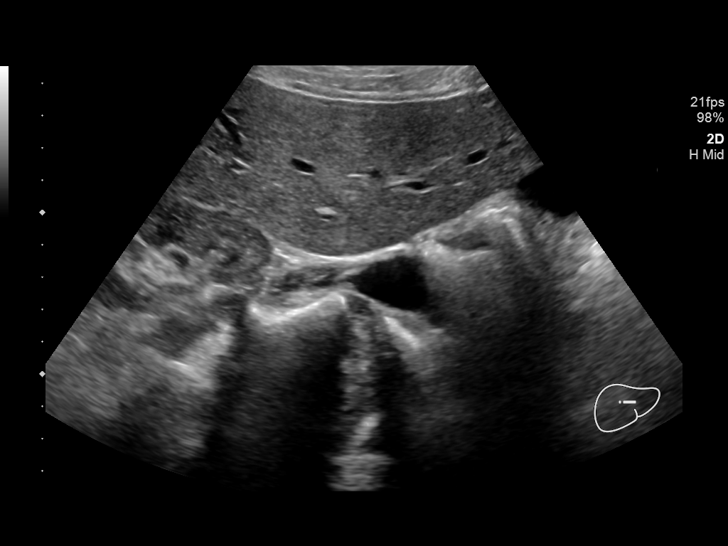
[im 58/108]
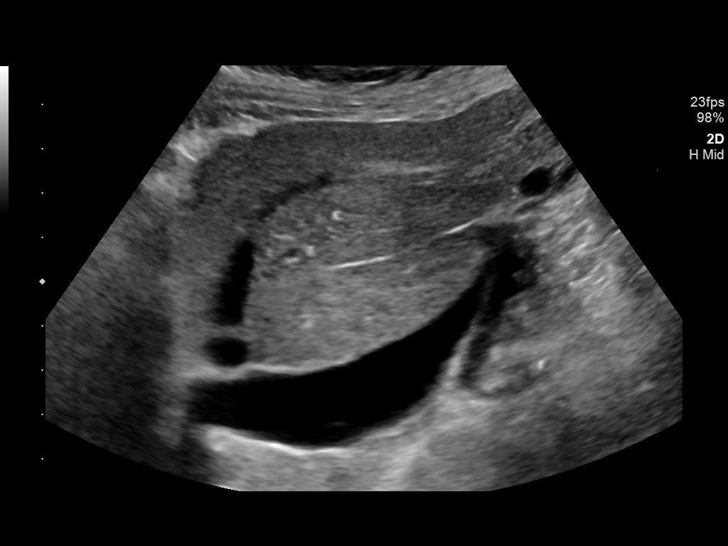
[im 67/108]
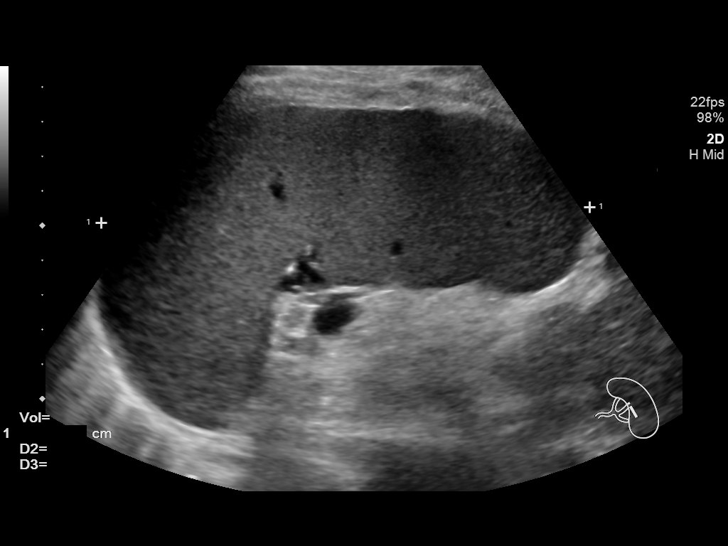
[im 72/108]
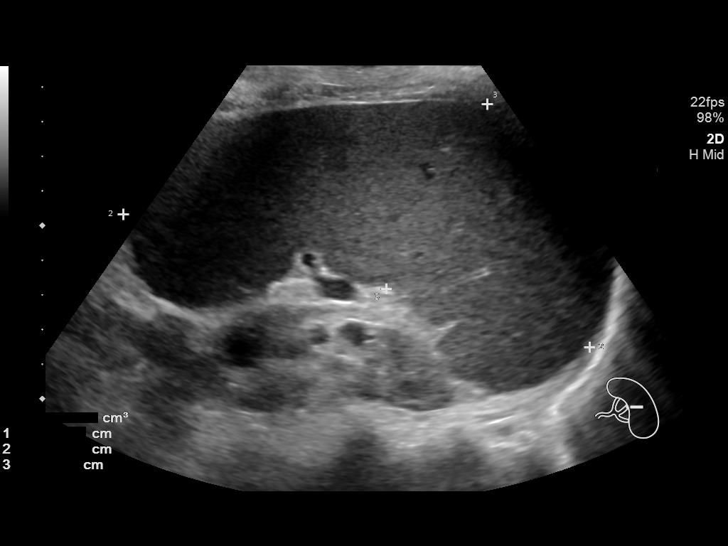
[im 81/108]
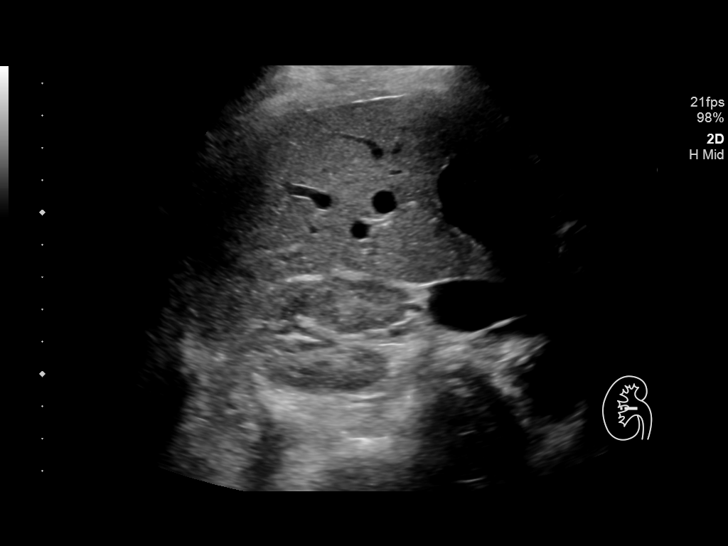
[im 90/108]
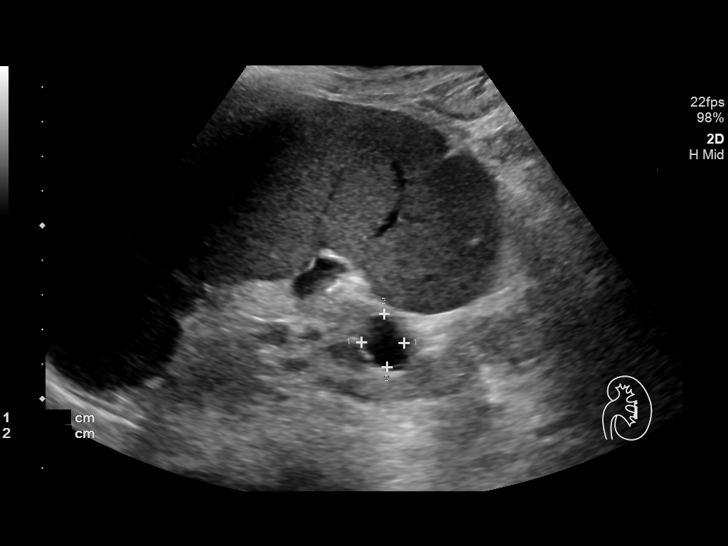
[im 99/108]
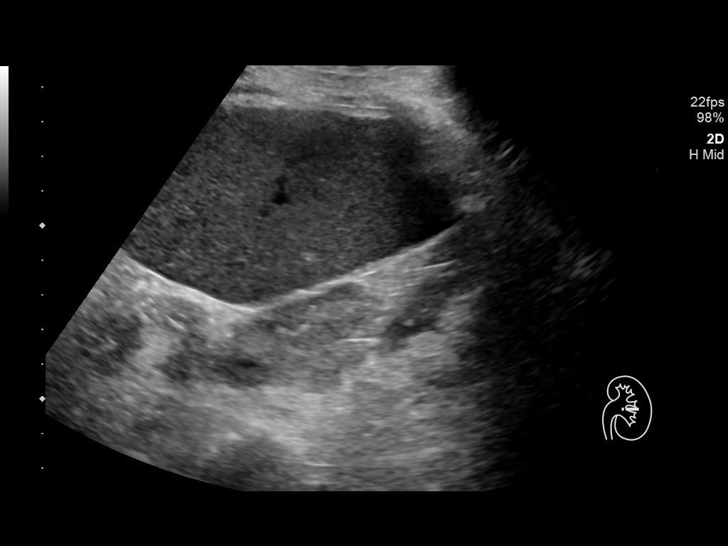
[im 108/108]
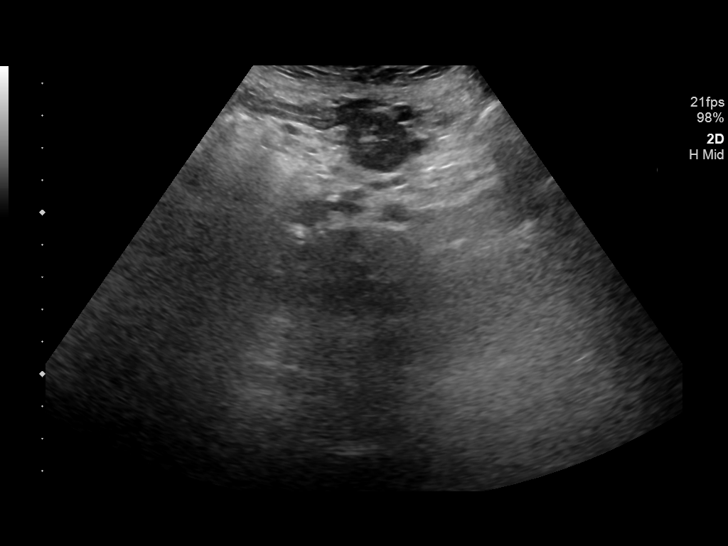

[14 of 25 positions shown; findings below may reference images not displayed]

FINDINGS: Gallbladder: No gallstones or wall thickening visualized. No
sonographic Murphy sign noted by sonographer.

Common bile duct: Diameter: 5 mm

Liver: No focal lesion identified. Within normal limits in
parenchymal echogenicity. Portal vein is patent on color Doppler
imaging with normal direction of blood flow towards the liver.

IVC: No abnormality visualized.

Pancreas: Visualized portion unremarkable.

Spleen: Enlarged measuring 14.2 cm in length (volume of 628 cc)

Right Kidney: Length: 9.1 cm. Echogenicity within normal limits. No
mass or hydronephrosis visualized.

Left Kidney: Length: 9.8 cm. Echogenicity within normal limits.
cm cyst. No suspicious mass or hydronephrosis visualized.

Abdominal aorta: No aneurysm visualized.

Other findings: None.
IMPRESSION: 1. Splenomegaly.  14.2 cm in length and volume of 628 cc.
2. Remainder of the abdomen ultrasound is unremarkable, as detailed
above.
